# Patient Record
Sex: Male | Born: 1985 | Race: White | Hispanic: No | Marital: Married
Health system: Southern US, Community
[De-identification: ages and names within clinical notes are randomized; demographics above are authoritative.]

## PROBLEM LIST (undated history)

## (undated) DIAGNOSIS — Z789 Other specified health status: Secondary | ICD-10-CM

## (undated) DIAGNOSIS — K529 Noninfective gastroenteritis and colitis, unspecified: Secondary | ICD-10-CM

## (undated) DIAGNOSIS — R197 Diarrhea, unspecified: Secondary | ICD-10-CM

## (undated) DIAGNOSIS — R1031 Right lower quadrant pain: Secondary | ICD-10-CM

## (undated) HISTORY — DX: Right lower quadrant pain: R10.31

## (undated) HISTORY — DX: Diarrhea, unspecified: R19.7

---

## 2015-10-24 ENCOUNTER — Encounter: Payer: Self-pay | Admitting: *Deleted

## 2015-10-24 ENCOUNTER — Emergency Department: Payer: Managed Care, Other (non HMO)

## 2015-10-24 ENCOUNTER — Inpatient Hospital Stay
Admission: EM | Admit: 2015-10-24 | Discharge: 2015-10-26 | DRG: 392 | Disposition: A | Discharge status: Home / Self Care | Payer: Managed Care, Other (non HMO) | Attending: Internal Medicine | Admitting: Internal Medicine

## 2015-10-24 DIAGNOSIS — R1031 Right lower quadrant pain: Secondary | ICD-10-CM | POA: Diagnosis not present

## 2015-10-24 DIAGNOSIS — Z8379 Family history of other diseases of the digestive system: Secondary | ICD-10-CM | POA: Diagnosis not present

## 2015-10-24 DIAGNOSIS — A09 Infectious gastroenteritis and colitis, unspecified: Principal | ICD-10-CM | POA: Diagnosis present

## 2015-10-24 DIAGNOSIS — K529 Noninfective gastroenteritis and colitis, unspecified: Secondary | ICD-10-CM | POA: Diagnosis present

## 2015-10-24 DIAGNOSIS — F17210 Nicotine dependence, cigarettes, uncomplicated: Secondary | ICD-10-CM | POA: Diagnosis present

## 2015-10-24 DIAGNOSIS — R197 Diarrhea, unspecified: Secondary | ICD-10-CM | POA: Diagnosis not present

## 2015-10-24 HISTORY — DX: Other specified health status: Z78.9

## 2015-10-24 LAB — CBC
HCT: 45.1 % (ref 40.0–52.0)
HEMOGLOBIN: 15.4 g/dL (ref 13.0–18.0)
MCH: 29.4 pg (ref 26.0–34.0)
MCHC: 34.2 g/dL (ref 32.0–36.0)
MCV: 85.8 fL (ref 80.0–100.0)
PLATELETS: 278 10*3/uL (ref 150–440)
RBC: 5.25 MIL/uL (ref 4.40–5.90)
RDW: 13 % (ref 11.5–14.5)
WBC: 13.2 10*3/uL — ABNORMAL HIGH (ref 3.8–10.6)

## 2015-10-24 LAB — URINALYSIS COMPLETE WITH MICROSCOPIC (ARMC ONLY)
Bacteria, UA: NONE SEEN
Bilirubin Urine: NEGATIVE
Glucose, UA: NEGATIVE mg/dL
Hgb urine dipstick: NEGATIVE
KETONES UR: NEGATIVE mg/dL
Leukocytes, UA: NEGATIVE
Nitrite: NEGATIVE
PH: 6 (ref 5.0–8.0)
PROTEIN: NEGATIVE mg/dL
RBC / HPF: NONE SEEN RBC/hpf (ref 0–5)
Specific Gravity, Urine: 1.01 (ref 1.005–1.030)

## 2015-10-24 LAB — COMPREHENSIVE METABOLIC PANEL
ALBUMIN: 4.5 g/dL (ref 3.5–5.0)
ALT: 72 U/L — AB (ref 17–63)
AST: 41 U/L (ref 15–41)
Alkaline Phosphatase: 74 U/L (ref 38–126)
Anion gap: 11 (ref 5–15)
BUN: 14 mg/dL (ref 6–20)
CHLORIDE: 105 mmol/L (ref 101–111)
CO2: 24 mmol/L (ref 22–32)
CREATININE: 1.06 mg/dL (ref 0.61–1.24)
Calcium: 9.2 mg/dL (ref 8.9–10.3)
GFR calc Af Amer: >60 mL/min (ref >60)
GFR calc non Af Amer: >60 mL/min (ref >60)
GLUCOSE: 117 mg/dL — AB (ref 65–99)
POTASSIUM: 4 mmol/L (ref 3.5–5.1)
Sodium: 140 mmol/L (ref 135–145)
Total Bilirubin: 0.9 mg/dL (ref 0.3–1.2)
Total Protein: 7.9 g/dL (ref 6.5–8.1)

## 2015-10-24 LAB — LIPASE, BLOOD: LIPASE: 24 U/L (ref 11–51)

## 2015-10-24 MED ORDER — CIPROFLOXACIN IN D5W 400 MG/200ML IV SOLN
400.0000 mg | Freq: Two times a day (BID) | INTRAVENOUS | Status: DC
  Administered 2015-10-25 – 2015-10-26 (×3): 400 mg via INTRAVENOUS
  Filled 2015-10-24 (×5): qty 200

## 2015-10-24 MED ORDER — HYDROMORPHONE HCL 1 MG/ML IJ SOLN
1.0000 mg | Freq: Once | INTRAMUSCULAR | Status: AC
  Administered 2015-10-24: 1 mg via INTRAVENOUS

## 2015-10-24 MED ORDER — CIPROFLOXACIN IN D5W 400 MG/200ML IV SOLN
INTRAVENOUS | Status: AC
  Administered 2015-10-24: 400 mg via INTRAVENOUS
  Filled 2015-10-24: qty 200

## 2015-10-24 MED ORDER — SODIUM CHLORIDE 0.9 % IV SOLN
INTRAVENOUS | Status: DC
  Administered 2015-10-24: 22:00:00 via INTRAVENOUS
  Administered 2015-10-25: 1000 mL via INTRAVENOUS
  Administered 2015-10-25 – 2015-10-26 (×2): via INTRAVENOUS

## 2015-10-24 MED ORDER — METRONIDAZOLE IN NACL 5-0.79 MG/ML-% IV SOLN
500.0000 mg | Freq: Three times a day (TID) | INTRAVENOUS | Status: DC
  Administered 2015-10-25 – 2015-10-26 (×4): 500 mg via INTRAVENOUS
  Filled 2015-10-24 (×6): qty 100

## 2015-10-24 MED ORDER — HYDROCODONE-ACETAMINOPHEN 5-325 MG PO TABS
1.0000 | ORAL_TABLET | ORAL | Status: DC | PRN
  Administered 2015-10-24: 1 via ORAL
  Administered 2015-10-25: 2 via ORAL
  Administered 2015-10-25: 1 via ORAL
  Administered 2015-10-25 – 2015-10-26 (×3): 2 via ORAL
  Filled 2015-10-24 (×2): qty 2
  Filled 2015-10-24: qty 1
  Filled 2015-10-24: qty 2
  Filled 2015-10-24: qty 1
  Filled 2015-10-24: qty 2

## 2015-10-24 MED ORDER — HYDROMORPHONE HCL 1 MG/ML IJ SOLN
INTRAMUSCULAR | Status: AC
  Administered 2015-10-24: 1 mg via INTRAVENOUS
  Filled 2015-10-24: qty 1

## 2015-10-24 MED ORDER — DIATRIZOATE MEGLUMINE & SODIUM 66-10 % PO SOLN
15.0000 mL | Freq: Once | ORAL | Status: AC
  Administered 2015-10-24: 15 mL via ORAL

## 2015-10-24 MED ORDER — IOPAMIDOL (ISOVUE-300) INJECTION 61%
75.0000 mL | Freq: Once | INTRAVENOUS | Status: AC | PRN
  Administered 2015-10-24: 75 mL via INTRAVENOUS
  Filled 2015-10-24: qty 75

## 2015-10-24 MED ORDER — METRONIDAZOLE IN NACL 5-0.79 MG/ML-% IV SOLN
500.0000 mg | Freq: Once | INTRAVENOUS | Status: AC
  Administered 2015-10-24: 500 mg via INTRAVENOUS
  Filled 2015-10-24 (×2): qty 100

## 2015-10-24 MED ORDER — ONDANSETRON HCL 4 MG/2ML IJ SOLN
4.0000 mg | Freq: Four times a day (QID) | INTRAMUSCULAR | Status: DC | PRN
  Administered 2015-10-25 (×2): 4 mg via INTRAVENOUS
  Filled 2015-10-24 (×2): qty 2

## 2015-10-24 MED ORDER — ONDANSETRON HCL 4 MG PO TABS: 4.0000 mg | ORAL_TABLET | Freq: Four times a day (QID) | ORAL | Status: DC | PRN

## 2015-10-24 MED ORDER — SODIUM CHLORIDE 0.9 % IV BOLUS (SEPSIS)
1000.0000 mL | Freq: Once | INTRAVENOUS | Status: AC
  Administered 2015-10-24: 1000 mL via INTRAVENOUS

## 2015-10-24 MED ORDER — ENOXAPARIN SODIUM 40 MG/0.4ML ~~LOC~~ SOLN
40.0000 mg | Freq: Every day | SUBCUTANEOUS | Status: DC
  Administered 2015-10-24: 40 mg via SUBCUTANEOUS
  Filled 2015-10-24: qty 0.4

## 2015-10-24 MED ORDER — ONDANSETRON HCL 4 MG/2ML IJ SOLN
4.0000 mg | Freq: Once | INTRAMUSCULAR | Status: AC | PRN
  Administered 2015-10-24: 4 mg via INTRAVENOUS
  Filled 2015-10-24: qty 2

## 2015-10-24 MED ORDER — MORPHINE SULFATE (PF) 4 MG/ML IV SOLN
INTRAVENOUS | Status: AC
  Administered 2015-10-24: 4 mg via INTRAVENOUS
  Filled 2015-10-24: qty 1

## 2015-10-24 MED ORDER — CIPROFLOXACIN IN D5W 400 MG/200ML IV SOLN
400.0000 mg | Freq: Once | INTRAVENOUS | Status: AC
  Administered 2015-10-24: 400 mg via INTRAVENOUS
  Filled 2015-10-24: qty 200

## 2015-10-24 MED ORDER — MORPHINE SULFATE (PF) 4 MG/ML IV SOLN
4.0000 mg | Freq: Once | INTRAVENOUS | Status: AC
  Administered 2015-10-24: 4 mg via INTRAVENOUS

## 2015-10-24 MED ORDER — HYDROMORPHONE HCL 1 MG/ML IJ SOLN
1.0000 mg | INTRAMUSCULAR | Status: DC | PRN
  Administered 2015-10-25 (×2): 1 mg via INTRAVENOUS
  Filled 2015-10-24 (×2): qty 1

## 2015-10-24 NOTE — ED Notes (Signed)
Pt states abd pain and nausea that began this AM, states several episodes of vomiting, abd pain RLQ

## 2015-10-24 NOTE — ED Notes (Signed)
CT tech notified that pt finished with contrast.

## 2015-10-24 NOTE — H&P (Signed)
[facility] [place] [facility]   PATIENT NAME: Christopher Mcdowell    MR#:  [id number]  DATE OF BIRTH:  07/11/1985  DATE OF ADMISSION:  10/24/2015  PRIMARY CARE PHYSICIAN: No primary care provider on file.   REQUESTING/REFERRING PHYSICIAN: dr Thomasene MohairPaduhowski  CHIEF COMPLAINT:  Abdominal pain nausea and vomiting today  HISTORY OF PRESENT ILLNESS:  Christopher Mcdowell Christopher Mcdowell  is a 30 y.o. male with no past medical history comes to the emergency room after he started having intractable nausea vomiting and diarrhea starting today. Patient reports say contact with his daughter and wife was recovering from gastroenteritis. His pain has not been able to control with I oral pain meds. He has required several doses of IV pain medication in the emergency room. CT of the abdomen shows ileitis. He he received Cipro and Flagyl.  Patient is being admitted for further management  PAST MEDICAL HISTORY:  History reviewed. No pertinent past medical history.  PAST SURGICAL HISTOIRY:  No past surgical history on file.  SOCIAL HISTORY:   Social History  Substance Use Topics  . Smoking status: Not on file  . Smokeless tobacco: Not on file  . Alcohol Use: Not on file    FAMILY HISTORY:  No past medical history of CAD, hypertension  DRUG ALLERGIES:  No Known Allergies  REVIEW OF SYSTEMS:  Review of Systems  Constitutional: Negative for fever, chills and weight loss.  HENT: Negative for ear discharge, ear pain and nosebleeds.   Eyes: Negative for blurred vision, pain and discharge.  Respiratory: Negative for sputum production, shortness of breath, wheezing and stridor.   Cardiovascular: Negative for chest pain, palpitations, orthopnea and PND.  Gastrointestinal: Positive for vomiting, abdominal pain and diarrhea. Negative for nausea.  Genitourinary: Negative for urgency and frequency.  Musculoskeletal: Negative for back pain and joint pain.  Neurological: Positive for weakness.  Negative for sensory change, speech change and focal weakness.  Psychiatric/Behavioral: Negative for depression and hallucinations. The patient is not nervous/anxious.   All other systems reviewed and are negative.    MEDICATIONS AT HOME:   Prior to Admission medications   Not on File      VITAL SIGNS:  Blood pressure 131/75, pulse 94, temperature 98.3 F (36.8 C), temperature source Oral, resp. rate 22, height 6' (1.829 m), weight 133.811 kg (295 lb), SpO2 98 %.  PHYSICAL EXAMINATION:  GENERAL:  30 y.o.-year-old patient lying in the bed with no acute distress.  EYES: Pupils equal, round, reactive to light and accommodation. No scleral icterus. Extraocular muscles intact.  HEENT: Head atraumatic, normocephalic. Oropharynx and nasopharynx clear.  NECK:  Supple, no jugular venous distention. No thyroid enlargement, no tenderness.  LUNGS: Normal breath sounds bilaterally, no wheezing, rales,rhonchi or crepitation. No use of accessory muscles of respiration.  CARDIOVASCULAR: S1, S2 normal. No murmurs, rubs, or gallops.  ABDOMEN: Soft, nontender, nondistended. Bowel sounds present. No organomegaly or mass.  EXTREMITIES: No pedal edema, cyanosis, or clubbing.  NEUROLOGIC: Cranial nerves II through XII are intact. Muscle strength 5/5 in all extremities. Sensation intact. Gait not checked.  PSYCHIATRIC: The patient is alert and oriented x 3.  SKIN: No obvious rash, lesion, or ulcer.   LABORATORY PANEL:   CBC  Recent Labs Lab 10/24/15 1706  WBC 13.2*  HGB 15.4  HCT 45.1  PLT 278   ------------------------------------------------------------------------------------------------------------------  Chemistries   Recent Labs Lab 10/24/15 1706  NA 140  K 4.0  CL 105  CO2 24  GLUCOSE 117*  BUN  14  CREATININE 1.06  CALCIUM 9.2  AST 41  ALT 72*  ALKPHOS 74  BILITOT 0.9    ------------------------------------------------------------------------------------------------------------------  Cardiac Enzymes No results for input(s): TROPONINI in the last 168 hours. ------------------------------------------------------------------------------------------------------------------  RADIOLOGY:  Ct Abdomen Pelvis W Contrast  10/24/2015  CLINICAL DATA:  Right lower quadrant abdomen pain and nausea starting this morning. EXAM: CT ABDOMEN AND PELVIS WITH CONTRAST TECHNIQUE: Multidetector CT imaging of the abdomen and pelvis was performed using the standard protocol following bolus administration of intravenous contrast. CONTRAST:  75mL ISOVUE-300 IOPAMIDOL (ISOVUE-300) INJECTION 61% COMPARISON:  None. FINDINGS: Lower chest: No acute findings. There is no focal pneumonia or pleural effusion. The heart size is normal. Hepatobiliary: There is mild diffuse low density of the liver. The gallbladder is normal. No masses or other significant abnormality. Pancreas: No mass, inflammatory changes, or other significant abnormality. Spleen: Within normal limits in size and appearance. Adrenals/Urinary Tract: The adrenal glands and kidneys are normal. The bladder is partially decompressed without gross abnormality. No masses identified. No evidence of hydronephrosis. Stomach/Bowel: Mild thick walled distal ileal small bowel loops with surrounding mesenteric stranding are identified. The appendix is normal. There is no small bowel obstruction or diverticulitis. There is a small hiatal hernia. Vascular/Lymphatic: No pathologically enlarged lymph nodes. No evidence of abdominal aortic aneurysm. Reproductive: No mass or other significant abnormality. Other: Small amount of free fluid is identified in the right pelvis. Musculoskeletal:  No suspicious bone lesions identified. IMPRESSION: Mild abnormal thick walled distal ileal small bowel loops with surrounding mesenteric stranding consistent with  ileitis. Normal appendix. Mild fatty infiltration of liver. Electronically Signed   By: Sherian Rein M.D.   On: 10/24/2015 19:38    EKG:    IMPRESSION AND PLAN:   [name]  is a 30 y.o. male with no past medical history comes to the emergency room after he started having intractable nausea vomiting and diarrhea starting today. Patient reports say contact with his daughter and wife was recovering from gastroenteritis. His pain has not been able to control with I oral pain meds.  1. Acute gastroenteritis Patient presented with intractable nausea vomiting and abdominal pain Treat symptomatically IV fluids, pain meds, anti-emetics.  2. Acute ileitis as noted on CT scan of the abdomen. Appears more infectious etiology. We'll give patient a course of Cipro and Flagyl. If symptoms recur patient advised to see GI as an outpatient. If symptoms do not improve consider inpatient GI consultation  3. DVT prophylaxis subcutaneous Lovenox  All the records are reviewed and case discussed with ED provider. Management plans discussed with the patient, family and they are in agreement.  CODE STATUS: Full  TOTAL TIME TAKING CARE OF THIS PATIENT: 50 minutes.    Willy Pinkerton M.D on 10/24/2015 at 8:46 PM  Between 7am to 6pm - Pager - [phone]  After 6pm go to [email or web address] - password [email or web address] [facility]  [facility]  Office  [phone]  CC: Primary care physician; No primary care provider on file.

## 2015-10-24 NOTE — ED Notes (Signed)
Pt informed that urine needed for test, states will try now.  

## 2015-10-24 NOTE — ED Provider Notes (Signed)
[facility] Emergency Department Provider Note  Time seen: 5:47 PM  I have reviewed the triage vital signs and the nursing notes.   HISTORY  Chief Complaint Abdominal Pain; Nausea; and Emesis    HPI Christopher Mcdowell is a 30 y.o. male with no past medical history who presents to the emergency department with lower abdominal pain nausea and vomiting. According to the patient he awoke this morning with nausea, has had several episodes of vomiting and several episodes of diarrhea today. Around 3 PM the patient developed significant abdominal pain around his belly button which is now progressed to be completely in the right lower quadrant. Sates the pain is a 7/10 status post morphine. States the nausea has largely resolved. Describes the pain as dull aching with occasional sharp pains. Denies dysuria or hematuria. Denies history of kidney stones. Denies fever.     History reviewed. No pertinent past medical history.  There are no active problems to display for this patient.   No past surgical history on file.  No current outpatient prescriptions on file.  Allergies Review of patient's allergies indicates no known allergies.  History reviewed. No pertinent family history.  Social History Social History  Substance Use Topics  . Smoking status: None  . Smokeless tobacco: None  . Alcohol Use: None    Review of Systems Constitutional: Negative for fever. Cardiovascular: Negative for chest pain. Respiratory: Negative for shortness of breath. Gastrointestinal: Right lower quadrant abdominal pain. Positive for nausea, vomiting, diarrhea. Genitourinary: Negative for dysuria. If her hematuria Musculoskeletal: Negative for back pain. Neurological: Negative for headache 10-point ROS otherwise negative.  ____________________________________________   PHYSICAL EXAM:  VITAL SIGNS: ED Triage Vitals  Enc Vitals Group     BP 10/24/15 1705 148/88 mmHg   Pulse Rate 10/24/15 1705 111     Resp 10/24/15 1705 18     Temp 10/24/15 1705 98.3 F (36.8 C)     Temp Source 10/24/15 1705 Oral     SpO2 10/24/15 1705 98 %     Weight 10/24/15 1705 295 lb (133.811 kg)     Height 10/24/15 1705 6' (1.829 m)     Head Cir --      Peak Flow --      Pain Score 10/24/15 1705 6     Pain Loc --      Pain Edu? --      Excl. in GC? --     Constitutional: Alert and oriented. Mild distress due to pain. Eyes: Normal exam ENT   Head: Normocephalic and atraumatic.   Mouth/Throat: Mucous membranes are moist. Cardiovascular: Normal rate, regular rhythm. No murmur Respiratory: Normal respiratory effort without tachypnea nor retractions. Breath sounds are clear  Gastrointestinal: Soft, moderate right lower quadrant tenderness to palpation. Positive for mild voluntary guarding to the right lower quadrant. Positive for rebound tenderness when pressing on the left lower quadrant. Musculoskeletal: Nontender with normal range of motion in all extremities.  Neurologic:  Normal speech and language. No gross focal neurologic deficits  Skin:  Skin is warm, dry and intact.  Psychiatric: Mood and affect are normal.   ____________________________________________    RADIOLOGY  CT consistent with ileitis  ____________________________________________   INITIAL IMPRESSION / ASSESSMENT AND PLAN / ED COURSE  Pertinent labs & imaging results that were available during my care of the patient were reviewed by me and considered in my medical decision making (see chart for details).  Patient presents the emergency department with a good  story for appendicitis. We'll control the patient's pain, check labs, proceed with CT abdomen/pelvis to further evaluate. We will IV hydrate. Patient has no medical problems, takes no medications, has no allergies.  CT consistent with ileitis. No family history of IBD. Given the patient's significant discomfort, status post 3 rounds of IV  pain medication we will admit to the hospital for pain control and further treatment. We will dose the patient's antibiotics in the emergency department and admitted to the hospitalist.  ____________________________________________   FINAL CLINICAL IMPRESSION(S) / ED DIAGNOSES  Right lower quadrant abdominal pain Ileitis  Minna Antis, MD 10/24/15 [phone]

## 2015-10-25 DIAGNOSIS — R197 Diarrhea, unspecified: Secondary | ICD-10-CM

## 2015-10-25 DIAGNOSIS — K529 Noninfective gastroenteritis and colitis, unspecified: Secondary | ICD-10-CM

## 2015-10-25 DIAGNOSIS — R1031 Right lower quadrant pain: Secondary | ICD-10-CM | POA: Insufficient documentation

## 2015-10-25 LAB — CBC WITH DIFFERENTIAL/PLATELET
BASOS ABS: 0 10*3/uL (ref 0–0.1)
Basophils Relative: 0 %
EOS ABS: 0 10*3/uL (ref 0–0.7)
Eosinophils Relative: 0 %
HCT: 44.7 % (ref 40.0–52.0)
Hemoglobin: 15.3 g/dL (ref 13.0–18.0)
LYMPHS ABS: 1.1 10*3/uL (ref 1.0–3.6)
Lymphocytes Relative: 12 %
MCH: 29.5 pg (ref 26.0–34.0)
MCHC: 34.3 g/dL (ref 32.0–36.0)
MCV: 86.1 fL (ref 80.0–100.0)
MONO ABS: 0.8 10*3/uL (ref 0.2–1.0)
MONOS PCT: 8 %
Neutro Abs: 7.6 10*3/uL — ABNORMAL HIGH (ref 1.4–6.5)
Neutrophils Relative %: 80 %
PLATELETS: 251 10*3/uL (ref 150–440)
RBC: 5.2 MIL/uL (ref 4.40–5.90)
RDW: 12.9 % (ref 11.5–14.5)
WBC: 9.5 10*3/uL (ref 3.8–10.6)

## 2015-10-25 MED ORDER — HYDROMORPHONE HCL 1 MG/ML IJ SOLN
1.0000 mg | Freq: Once | INTRAMUSCULAR | Status: AC
Start: 2015-10-25 — End: 2015-10-25
  Administered 2015-10-25: 1 mg via INTRAVENOUS
  Filled 2015-10-25: qty 1

## 2015-10-25 MED ORDER — PANTOPRAZOLE SODIUM 40 MG IV SOLR
40.0000 mg | Freq: Two times a day (BID) | INTRAVENOUS | Status: DC
  Administered 2015-10-25 (×2): 40 mg via INTRAVENOUS
  Filled 2015-10-25 (×3): qty 40

## 2015-10-25 MED ORDER — HYDROMORPHONE HCL 1 MG/ML IJ SOLN
2.0000 mg | INTRAMUSCULAR | Status: DC | PRN
  Administered 2015-10-25 – 2015-10-26 (×4): 2 mg via INTRAVENOUS
  Filled 2015-10-25 (×4): qty 2

## 2015-10-25 MED ORDER — ENOXAPARIN SODIUM 40 MG/0.4ML ~~LOC~~ SOLN
40.0000 mg | Freq: Two times a day (BID) | SUBCUTANEOUS | Status: DC
  Administered 2015-10-25 – 2015-10-26 (×3): 40 mg via SUBCUTANEOUS
  Filled 2015-10-25 (×3): qty 0.4

## 2015-10-25 NOTE — Progress Notes (Signed)
Pharmacy Note - Anticoagulation  Patient with orders for enoxaparin 40mg  SQ QHS for VTE prophylaxis  Estimated Creatinine Clearance: 146.9 mL/min (by C-G formula based on Cr of 1.06). Body mass index is 40 kg/(m^2).  Will adjust to enoxaparin 40mg  SQ Q12H for BMI > 40 and CrCl > 230ml/min  Garlon HatchetJody Michaelina Blandino, PharmD Clinical Pharmacist  10/25/2015 10:14 AM

## 2015-10-25 NOTE — Consult Note (Signed)
Surgical Consultation  10/25/2015  Christopher Mcdowell is an 30 y.o. male.   CC right lower quadrant pain  HPI: This a patient with diarrhea and nausea and vomiting and right lower quadrant abdominal pain is been going on for a few days. 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 He is a definite Tour manager. He's had what he thought were fevers and chills but has not actually been febrile but has had some shaking chills. His wife may have had a gastroenteritis recently with diarrhea and a single emesis and was told that she may have noro virus. Patient is ever had an episode like this before has no melena or hematochezia. Past Medical History  Diagnosis Date  . Medical history non-contributory     History reviewed. No pertinent past surgical history.  History reviewed. No pertinent family history.  Social History:  reports that he has been passively smoking.  He uses smokeless tobacco. He reports that he does not drink alcohol or use illicit drugs.  Allergies: No Known Allergies  Medications reviewed.   Review of Systems:   Review of Systems  Constitutional: Positive for fever, chills and malaise/fatigue.  HENT: Negative.   Eyes: Negative.   Respiratory: Negative.   Cardiovascular: Negative.   Gastrointestinal: Positive for nausea, vomiting, abdominal pain and diarrhea. Negative for heartburn, constipation, blood in stool and melena.  Genitourinary: Negative.   Musculoskeletal: Negative.   Skin: Negative.   Neurological: Negative.   Endo/Heme/Allergies: Negative.   Psychiatric/Behavioral: Negative.      Physical Exam:  BP 126/67 mmHg  Pulse 88  Temp(Src) 98.1 F (36.7 C) (Oral)  Resp 19  Ht 6' (1.829 m)  Wt 295 lb (133.811 kg)  BMI 40.00 kg/m2  SpO2 98%  Physical Exam  Constitutional: He is oriented to person, place, and time and well-developed, well-nourished, and in no distress.  Visible shaking chills during the interview  HENT:  Head:  Normocephalic and atraumatic.  Eyes: Pupils are equal, round, and reactive to light. Right eye exhibits no discharge. Left eye exhibits no discharge. No scleral icterus.  Neck: Normal range of motion.  Cardiovascular: Normal rate, regular rhythm and normal heart sounds.   Pulmonary/Chest: Effort normal and breath sounds normal. No respiratory distress. He has no wheezes.  Abdominal: Soft. He exhibits no distension. There is tenderness. There is no rebound and no guarding.  Very minimal tenderness no peritoneal signs maximal in the suprapubic right lower quadrant area but no signs of peritoneal irritation  Musculoskeletal: Normal range of motion. He exhibits no edema.  Lymphadenopathy:    He has no cervical adenopathy.  Neurological: He is alert and oriented to person, place, and time.  Skin: Skin is warm and dry. No erythema.  Psychiatric: Mood and affect normal.  Vitals reviewed.     Results for orders placed or performed during the hospital encounter of 10/24/15 (from the past 48 hour(s))  Lipase, blood     Status: None   Collection Time: 10/24/15  5:06 PM  Result Value Ref Range   Lipase 24 11 - 51 U/L  Comprehensive metabolic panel     Status: Abnormal   Collection Time: 10/24/15  5:06 PM  Result Value Ref Range   Sodium 140 135 - 145 mmol/L   Potassium 4.0 3.5 - 5.1 mmol/L   Chloride 105 101 - 111 mmol/L   CO2 24 22 - 32 mmol/L   Glucose, Bld 117 (H) 65 - 99 mg/dL   BUN 14  6 - 20 mg/dL   Creatinine, Ser 1.06 0.61 - 1.24 mg/dL   Calcium 9.2 8.9 - 10.3 mg/dL   Total Protein 7.9 6.5 - 8.1 g/dL   Albumin 4.5 3.5 - 5.0 g/dL   AST 41 15 - 41 U/L   ALT 72 (H) 17 - 63 U/L   Alkaline Phosphatase 74 38 - 126 U/L   Total Bilirubin 0.9 0.3 - 1.2 mg/dL   GFR calc non Af Amer >60 >60 mL/min   GFR calc Af Amer >60 >60 mL/min    Comment: (NOTE) The eGFR has been calculated using the CKD EPI equation. This calculation has not been validated in all clinical situations. eGFR's  persistently <60 mL/min signify possible Chronic Kidney Disease.    Anion gap 11 5 - 15  CBC     Status: Abnormal   Collection Time: 10/24/15  5:06 PM  Result Value Ref Range   WBC 13.2 (H) 3.8 - 10.6 K/uL   RBC 5.25 4.40 - 5.90 MIL/uL   Hemoglobin 15.4 13.0 - 18.0 g/dL   HCT 45.1 40.0 - 52.0 %   MCV 85.8 80.0 - 100.0 fL   MCH 29.4 26.0 - 34.0 pg   MCHC 34.2 32.0 - 36.0 g/dL   RDW 13.0 11.5 - 14.5 %   Platelets 278 150 - 440 K/uL  Urinalysis complete, with microscopic ([facility] only)     Status: Abnormal   Collection Time: 10/24/15  7:16 PM  Result Value Ref Range   Color, Urine YELLOW (A) YELLOW   APPearance CLEAR (A) CLEAR   Glucose, UA NEGATIVE NEGATIVE mg/dL   Bilirubin Urine NEGATIVE NEGATIVE   Ketones, ur NEGATIVE NEGATIVE mg/dL   Specific Gravity, Urine 1.010 1.005 - 1.030   Hgb urine dipstick NEGATIVE NEGATIVE   pH 6.0 5.0 - 8.0   Protein, ur NEGATIVE NEGATIVE mg/dL   Nitrite NEGATIVE NEGATIVE   Leukocytes, UA NEGATIVE NEGATIVE   RBC / HPF NONE SEEN 0 - 5 RBC/hpf   WBC, UA 0-5 0 - 5 WBC/hpf   Bacteria, UA NONE SEEN NONE SEEN   Squamous Epithelial / LPF 0-5 (A) NONE SEEN   Mucous PRESENT   CBC with Differential/Platelet     Status: Abnormal   Collection Time: 10/25/15 11:55 AM  Result Value Ref Range   WBC 9.5 3.8 - 10.6 K/uL   RBC 5.20 4.40 - 5.90 MIL/uL   Hemoglobin 15.3 13.0 - 18.0 g/dL   HCT 44.7 40.0 - 52.0 %   MCV 86.1 80.0 - 100.0 fL   MCH 29.5 26.0 - 34.0 pg   MCHC 34.3 32.0 - 36.0 g/dL   RDW 12.9 11.5 - 14.5 %   Platelets 251 150 - 440 K/uL   Neutrophils Relative % 80 %   Lymphocytes Relative 12 %   Monocytes Relative 8 %   Eosinophils Relative 0 %   Basophils Relative 0 %   Neutro Abs 7.6 (H) 1.4 - 6.5 K/uL   Lymphs Abs 1.1 1.0 - 3.6 K/uL   Monocytes Absolute 0.8 0.2 - 1.0 K/uL   Eosinophils Absolute 0.0 0 - 0.7 K/uL   Basophils Absolute 0.0 0 - 0.1 K/uL   Ct Abdomen Pelvis W Contrast  10/24/2015  CLINICAL DATA:  Right lower quadrant abdomen  pain and nausea starting this morning. EXAM: CT ABDOMEN AND PELVIS WITH CONTRAST TECHNIQUE: Multidetector CT imaging of the abdomen and pelvis was performed using the standard protocol following bolus administration of intravenous contrast. CONTRAST:  41m ISOVUE-300  IOPAMIDOL (ISOVUE-300) INJECTION 61% COMPARISON:  None. FINDINGS: Lower chest: No acute findings. There is no focal pneumonia or pleural effusion. The heart size is normal. Hepatobiliary: There is mild diffuse low density of the liver. The gallbladder is normal. No masses or other significant abnormality. Pancreas: No mass, inflammatory changes, or other significant abnormality. Spleen: Within normal limits in size and appearance. Adrenals/Urinary Tract: The adrenal glands and kidneys are normal. The bladder is partially decompressed without gross abnormality. No masses identified. No evidence of hydronephrosis. Stomach/Bowel: Mild thick walled distal ileal small bowel loops with surrounding mesenteric stranding are identified. The appendix is normal. There is no small bowel obstruction or diverticulitis. There is a small hiatal hernia. Vascular/Lymphatic: No pathologically enlarged lymph nodes. No evidence of abdominal aortic aneurysm. Reproductive: No mass or other significant abnormality. Other: Small amount of free fluid is identified in the right pelvis. Musculoskeletal:  No suspicious bone lesions identified. IMPRESSION: Mild abnormal thick walled distal ileal small bowel loops with surrounding mesenteric stranding consistent with ileitis. Normal appendix. Mild fatty infiltration of liver. Electronically Signed   By: Abelardo Diesel M.D.   On: 10/24/2015 19:38    Assessment/Plan:  This patient with nausea vomiting diarrhea and right lower quadrant pain without signs of peritoneal irritation or other signs to suggest appendicitis his wife had been ill earlier in the week with a similar but less severe syndrome. There was a question of normal  virus. Currently I see no evidence of this could be a surgical condition and the patient's family has a remote history of Crohn's disease. At that in mind I would have no surgical plans for this patient but will continue to observe.  Florene Glen, MD, FACS

## 2015-10-25 NOTE — Progress Notes (Signed)
[facility] - [place] at [facility]                                                                                                                                                                                            Patient Demographics   Christopher Mcdowell, is a 30 y.o. male, DOB - [birth year], AVW:[id number]  Admit date - 10/24/2015   Admitting Physician Enedina Finner, MD  Outpatient Primary MD for the patient is No primary care provider on file.   LOS - 1  Subjective:pt c/o severe abdominal pain right lower abdomen     Review of Systems:   CONSTITUTIONAL: No documented fever. No fatigue, weakness. No weight gain, no weight loss.  EYES: No blurry or double vision.  ENT: No tinnitus. No postnasal drip. No redness of the oropharynx.  RESPIRATORY: No cough, no wheeze, no hemoptysis. No dyspnea.  CARDIOVASCULAR: No chest pain. No orthopnea. No palpitations. No syncope.  GASTROINTESTINAL: No nausea, no vomiting or diarrhea. Right lower abdominal pain. No melena or hematochezia.  GENITOURINARY: No dysuria or hematuria.  ENDOCRINE: No polyuria or nocturia. No heat or cold intolerance.  HEMATOLOGY: No anemia. No bruising. No bleeding.  INTEGUMENTARY: No rashes. No lesions.  MUSCULOSKELETAL: No arthritis. No swelling. No gout.  NEUROLOGIC: No numbness, tingling, or ataxia. No seizure-type activity.  PSYCHIATRIC: No anxiety. No insomnia. No ADD.    Vitals:   Filed Vitals:   10/24/15 2134 10/25/15 0529 10/25/15 1028 10/25/15 1200  BP: 130/68 123/63  126/67  Pulse: 114 89  88  Temp: 98.3 F (36.8 C) 98 F (36.7 C) 98.5 F (36.9 C) 98.1 F (36.7 C)  TempSrc: Oral Oral Oral Oral  Resp: Height:      Weight:      SpO2: 98% 97%  98%    Wt Readings from Last 3 Encounters:  10/24/15 133.811 kg (295 lb)     Intake/Output Summary (Last 24 hours) at 10/25/15 1434 Last data filed at 10/25/15 1300  Gross per 24 hour  Intake 1002.5 ml  Output     800 ml  Net  202.5 ml    Physical Exam:   GENERAL: Pleasant-appearing in no apparent distress.  HEAD, EYES, EARS, NOSE AND THROAT: Atraumatic, normocephalic. Extraocular muscles are intact. Pupils equal and reactive to light. Sclerae anicteric. No conjunctival injection. No oro-pharyngeal erythema.  NECK: Supple. There is no jugular venous distention. No bruits, no lymphadenopathy, no thyromegaly.  HEART: Regular rate and rhythm,. No murmurs, no rubs, no clicks.  LUNGS: Clear to auscultation bilaterally. No rales or rhonchi. No wheezes.  ABDOMEN:  Soft, flat, right lower abdomen tenderness, nondistended. Has good bowel sounds. No hepatosplenomegaly appreciated.  EXTREMITIES: No evidence of any cyanosis, clubbing, or peripheral edema.  +2 pedal and radial pulses bilaterally.  NEUROLOGIC: The patient is alert, awake, and oriented x3 with no focal motor or sensory deficits appreciated bilaterally.  SKIN: Moist and warm with no rashes appreciated.  Psych: Not anxious, depressed LN: No inguinal LN enlargement    Antibiotics   Anti-infectives    Start     Dose/Rate Route Frequency Ordered Stop   10/25/15 0800  ciprofloxacin (CIPRO) IVPB 400 mg     400 mg 200 mL/hr over 60 Minutes Intravenous Every 12 hours 10/24/15 2045     10/24/15 2100  metroNIDAZOLE (FLAGYL) IVPB 500 mg     500 mg 100 mL/hr over 60 Minutes Intravenous Every 8 hours 10/24/15 2045     10/24/15 2000  metroNIDAZOLE (FLAGYL) IVPB 500 mg     500 mg 100 mL/hr over 60 Minutes Intravenous  Once 10/24/15 1951 10/24/15 2101   10/24/15 2000  ciprofloxacin (CIPRO) IVPB 400 mg     400 mg 200 mL/hr over 60 Minutes Intravenous  Once 10/24/15 1951 10/24/15 2101      Medications   Scheduled Meds: . ciprofloxacin  400 mg Intravenous Q12H  . enoxaparin (LOVENOX) injection  40 mg Subcutaneous Q12H  . metronidazole  500 mg Intravenous Q8H  . pantoprazole (PROTONIX) IV  40 mg Intravenous Q12H   Continuous Infusions: . sodium  chloride 1,000 mL (10/25/15 1349)   PRN Meds:.HYDROcodone-acetaminophen, HYDROmorphone (DILAUDID) injection, ondansetron **OR** ondansetron (ZOFRAN) IV   Data Review:   Micro Results No results found for this or any previous visit (from the past 240 hour(s)).  Radiology Reports Ct Abdomen Pelvis W Contrast  10/24/2015  CLINICAL DATA:  Right lower quadrant abdomen pain and nausea starting this morning. EXAM: CT ABDOMEN AND PELVIS WITH CONTRAST TECHNIQUE: Multidetector CT imaging of the abdomen and pelvis was performed using the standard protocol following bolus administration of intravenous contrast. CONTRAST:  75mL ISOVUE-300 IOPAMIDOL (ISOVUE-300) INJECTION 61% COMPARISON:  None. FINDINGS: Lower chest: No acute findings. There is no focal pneumonia or pleural effusion. The heart size is normal. Hepatobiliary: There is mild diffuse low density of the liver. The gallbladder is normal. No masses or other significant abnormality. Pancreas: No mass, inflammatory changes, or other significant abnormality. Spleen: Within normal limits in size and appearance. Adrenals/Urinary Tract: The adrenal glands and kidneys are normal. The bladder is partially decompressed without gross abnormality. No masses identified. No evidence of hydronephrosis. Stomach/Bowel: Mild thick walled distal ileal small bowel loops with surrounding mesenteric stranding are identified. The appendix is normal. There is no small bowel obstruction or diverticulitis. There is a small hiatal hernia. Vascular/Lymphatic: No pathologically enlarged lymph nodes. No evidence of abdominal aortic aneurysm. Reproductive: No mass or other significant abnormality. Other: Small amount of free fluid is identified in the right pelvis. Musculoskeletal:  No suspicious bone lesions identified. IMPRESSION: Mild abnormal thick walled distal ileal small bowel loops with surrounding mesenteric stranding consistent with ileitis. Normal appendix. Mild fatty  infiltration of liver. Electronically Signed   By: Sherian Rein M.D.   On: 10/24/2015 19:38     CBC  Recent Labs Lab 10/24/15 1706 10/25/15 1155  WBC 13.2* 9.5  HGB 15.4 15.3  HCT 45.1 44.7  PLT 278 251  MCV 85.8 86.1  MCH 29.4 29.5  MCHC 34.2 34.3  RDW 13.0 12.9  LYMPHSABS  --  1.1  MONOABS  --  0.8  EOSABS  --  0.0  BASOSABS  --  0.0    Chemistries   Recent Labs Lab 10/24/15 1706  NA 140  K 4.0  CL 105  CO2 24  GLUCOSE 117*  BUN 14  CREATININE 1.06  CALCIUM 9.2  AST 41  ALT 72*  ALKPHOS 74  BILITOT 0.9   ------------------------------------------------------------------------------------------------------------------ estimated creatinine clearance is 146.9 mL/min (by C-G formula based on Cr of 1.06). ------------------------------------------------------------------------------------------------------------------ No results for input(s): HGBA1C in the last 72 hours. ------------------------------------------------------------------------------------------------------------------ No results for input(s): CHOL, HDL, LDLCALC, TRIG, CHOLHDL, LDLDIRECT in the last 72 hours. ------------------------------------------------------------------------------------------------------------------ No results for input(s): TSH, T4TOTAL, T3FREE, THYROIDAB in the last 72 hours.  Invalid input(s): FREET3 ------------------------------------------------------------------------------------------------------------------ No results for input(s): VITAMINB12, FOLATE, FERRITIN, TIBC, IRON, RETICCTPCT in the last 72 hours.  Coagulation profile No results for input(s): INR, PROTIME in the last 168 hours.  No results for input(s): DDIMER in the last 72 hours.  Cardiac Enzymes No results for input(s): CKMB, TROPONINI, MYOGLOBIN in the last 168 hours.  Invalid input(s):  CK ------------------------------------------------------------------------------------------------------------------ Invalid input(s): POCBNP    Assessment & Plan   Lauris ChromanDerrick Depree is a 30 y.o. male with no past medical history comes to the emergency room after he started having intractable nausea vomiting and diarrhea starting today. Patient reports say contact with his daughter and wife was recovering from gastroenteritis. His pain has not been able to control with I oral pain meds.  1. Acute gastroenteritis Supportive care with IVF  2. Acute ileitis as noted on CT scan of the abdomen. Pt with severe abdominal pain , I will ask GI to see as well as severe pain  I start patient on Protonix IV bid  3. DVT prophylaxis subcutaneous Lovenox      Code Status Orders        Start     Ordered   10/24/15 2138  Full code   Continuous     10/24/15 2137    Code Status History    Date Active Date Inactive Code Status Order ID Comments User Context   This patient has a current code status but no historical code status.    Advance Directive Documentation        Most Recent Value   Type of Advance Directive  Healthcare Power of Attorney, Living will   Pre-existing out of facility DNR order (yellow form or pink MOST form)     "MOST" Form in Place?             Consults   Gi, surgery   DVT Prophylaxis  Lovenox  Lab Results  Component Value Date   PLT 251 10/25/2015     Time Spent in minutes 35min  Greater than 50% of time spent in care coordination and counseling patient regarding the condition and plan of care.   Auburn BilberryPATEL, Ladeana Laplant M.D on 10/25/2015 at 2:34 PM  Between 7am to 6pm - Pager - [phone]  After 6pm go to [email or web address] - password [email or web address] [facility]   Office  [phone]

## 2015-10-25 NOTE — Consult Note (Signed)
GI Inpatient Consult Note  Reason for Consult: Abdominal pain   Attending Requesting Consult: Dr. Allena Mcdowell  History of Present Illness: Christopher Mcdowell is a 30 y.o. male with no significant past medical history reports that he started having nausea and diarrhea yesterday morning around 8 AM.  This continued until 3:00 yesterday afternoon and then he began to have 10 out of 10 abdominal pain that occurred during a bowel movement.  He began to vomit at that time and continued vomiting until 4:30 PM.  He presented to the emergency room around 5 PM due to the continuing abdominal pain.  He described it as right lower and mid abdominal pain radiating to his groin area.  He also reports chills, afebrile.  He denies any blood.  His wife is present and reports that she had been feeling sick on Tuesday, she was experiencing nausea and fever, went to her primary care provider told to put on a mask and at that time began to feel more nauseous and had a single episode of vomiting and diarrhea.  She was given a shot of Phenergan and reports she felt fine the next day.  Their daughter does attend daycare, reports she believes she got the illness from there.  [place] in [place], he works closer to this area. He was at work during this episode.  He has never had a colonoscopy before, no family history of colon cancer rectal cancer, no family history of autoimmune disease.   Past Medical History:  Past Medical History  Diagnosis Date  . Medical history non-contributory     Problem List: Patient Active Problem List   Diagnosis Date Noted  . Ileitis 10/24/2015    Past Surgical History: History reviewed. No pertinent past surgical history.  Allergies: No Known Allergies  Home Medications: No prescriptions prior to admission   Home medication reconciliation was completed with the patient.   Scheduled Inpatient Medications:   . ciprofloxacin  400 mg Intravenous Q12H  . enoxaparin  (LOVENOX) injection  40 mg Subcutaneous Q12H  . metronidazole  500 mg Intravenous Q8H  . pantoprazole (PROTONIX) IV  40 mg Intravenous Q12H    Continuous Inpatient Infusions:   . sodium chloride 75 mL/hr at 10/24/15 2200    PRN Inpatient Medications:  HYDROcodone-acetaminophen, HYDROmorphone (DILAUDID) injection, ondansetron **OR** ondansetron (ZOFRAN) IV  Family History: family history is not on file.   Social History:   reports that he has been passively smoking.  He uses smokeless tobacco. He reports that he does not drink alcohol or use illicit drugs. T  Review of Systems: Constitutional: Weight is stable.  Eyes: No changes in vision. ENT: No oral lesions, sore throat.  GI: see HPI.  Heme/Lymph: No easy bruising.  CV: No chest pain.  GU: No hematuria.  Integumentary: No rashes.  Neuro: No headaches.  Psych: No depression/anxiety.  Endocrine: No heat/cold intolerance.  Allergic/Immunologic: No urticaria.  Resp: No cough, SOB.  Musculoskeletal: No joint swelling.    Physical Examination: BP 126/67 mmHg  Pulse 88  Temp(Src) 98.1 F (36.7 C) (Oral)  Resp 19  Ht 6' (1.829 m)  Wt 133.811 kg (295 lb)  BMI 40.00 kg/m2  SpO2 98% Gen: NAD, alert and oriented x 4, wife is present and helps with history HEENT: PEERLA, EOMI, Neck: supple, no JVD or thyromegaly Chest: CTA bilaterally, no wheezes, crackles, or other adventitious sounds CV: RRR, no m/g/c/r Abd: generalized tenderness, ND, +BS in all four quadrants; no HSM, guarding, ridigity,  or rebound tenderness Ext: no edema, well perfused with 2+ pulses, Skin: no rash or lesions noted Lymph: no LAD  Data: Lab Results  Component Value Date   WBC 9.5 10/25/2015   HGB 15.3 10/25/2015   HCT 44.7 10/25/2015   MCV 86.1 10/25/2015   PLT 251 10/25/2015    Recent Labs Lab 10/24/15 1706 10/25/15 1155  HGB 15.4 15.3   Lab Results  Component Value Date   NA 140 10/24/2015   K 4.0 10/24/2015   CL 105 10/24/2015    CO2 24 10/24/2015   BUN 14 10/24/2015   CREATININE 1.06 10/24/2015   Lab Results  Component Value Date   ALT 72* 10/24/2015   AST 41 10/24/2015   ALKPHOS 74 10/24/2015   BILITOT 0.9 10/24/2015   No results for input(s): APTT, INR, PTT in the last 168 hours.  Imaging: CLINICAL DATA: Right lower quadrant abdomen pain and nausea starting this morning.  EXAM: CT ABDOMEN AND PELVIS WITH CONTRAST  TECHNIQUE: Multidetector CT imaging of the abdomen and pelvis was performed using the standard protocol following bolus administration of intravenous contrast.  CONTRAST: 75mL ISOVUE-300 IOPAMIDOL (ISOVUE-300) INJECTION 61%  COMPARISON: None.  FINDINGS: Lower chest: No acute findings. There is no focal pneumonia or pleural effusion. The heart size is normal.  Hepatobiliary: There is mild diffuse low density of the liver. The gallbladder is normal. No masses or other significant abnormality.  Pancreas: No mass, inflammatory changes, or other significant abnormality.  Spleen: Within normal limits in size and appearance.  Adrenals/Urinary Tract: The adrenal glands and kidneys are normal. The bladder is partially decompressed without gross abnormality. No masses identified. No evidence of hydronephrosis.  Stomach/Bowel: Mild thick walled distal ileal small bowel loops with surrounding mesenteric stranding are identified. The appendix is normal. There is no small bowel obstruction or diverticulitis. There is a small hiatal hernia.  Vascular/Lymphatic: No pathologically enlarged lymph nodes. No evidence of abdominal aortic aneurysm.  Reproductive: No mass or other significant abnormality.  Other: Small amount of free fluid is identified in the right pelvis.  Musculoskeletal: No suspicious bone lesions identified.  IMPRESSION: Mild abnormal thick walled distal ileal small bowel loops with surrounding mesenteric stranding consistent with ileitis.  Normal  appendix.  Mild fatty infiltration of liver.   Electronically Signed  By: Sherian ReinWei-Chen Lin M.D.  On: 10/24/2015 19:38  Assessment/Plan: Mr. Christopher Mcdowell is a 30 y.o. male  With abdominal pain, ileitis on CT scan, WBC on admission 13.2, currently 9.5.  Is currently being treated with Cipro and Flagyl, Protonix 40 mg twice a day IV, Zofran, tolerating clear liquids   Recommendations: We recommend continuing Cipro/Flagyl, completing GI panel when able, continuing to monitor symptoms and advance diet as tolerated.  We will continue to follow with you.  Thank you for the consult. Please call with questions or concerns.  Carney Harderari S Richards, PA-C  I personally performed these services.

## 2015-10-26 LAB — CBC
HCT: 42 % (ref 40.0–52.0)
Hemoglobin: 13.9 g/dL (ref 13.0–18.0)
MCH: 29 pg (ref 26.0–34.0)
MCHC: 33.2 g/dL (ref 32.0–36.0)
MCV: 87.3 fL (ref 80.0–100.0)
PLATELETS: 204 10*3/uL (ref 150–440)
RBC: 4.81 MIL/uL (ref 4.40–5.90)
RDW: 13 % (ref 11.5–14.5)
WBC: 12.3 10*3/uL — ABNORMAL HIGH (ref 3.8–10.6)

## 2015-10-26 LAB — GASTROINTESTINAL PANEL BY PCR, STOOL (REPLACES STOOL CULTURE)
ADENOVIRUS F40/41: NOT DETECTED
Astrovirus: NOT DETECTED
CRYPTOSPORIDIUM: NOT DETECTED
Campylobacter species: NOT DETECTED
Cyclospora cayetanensis: NOT DETECTED
E. coli O157: NOT DETECTED
ENTAMOEBA HISTOLYTICA: NOT DETECTED
ENTEROAGGREGATIVE E COLI (EAEC): NOT DETECTED
Enteropathogenic E coli (EPEC): NOT DETECTED
Enterotoxigenic E coli (ETEC): NOT DETECTED
GIARDIA LAMBLIA: NOT DETECTED
NOROVIRUS GI/GII: NOT DETECTED
Plesimonas shigelloides: NOT DETECTED
Rotavirus A: NOT DETECTED
SALMONELLA SPECIES: NOT DETECTED
SHIGELLA/ENTEROINVASIVE E COLI (EIEC): NOT DETECTED
Sapovirus (I, II, IV, and V): DETECTED — AB
Shiga like toxin producing E coli (STEC): NOT DETECTED
VIBRIO CHOLERAE: NOT DETECTED
Vibrio species: NOT DETECTED
YERSINIA ENTEROCOLITICA: NOT DETECTED

## 2015-10-26 MED ORDER — METRONIDAZOLE 500 MG PO TABS: 500.0000 mg | ORAL_TABLET | Freq: Three times a day (TID) | ORAL | Status: DC

## 2015-10-26 MED ORDER — HYDROCODONE-ACETAMINOPHEN 5-325 MG PO TABS: 1.0000 | ORAL_TABLET | ORAL | Status: DC | PRN

## 2015-10-26 MED ORDER — METRONIDAZOLE 500 MG PO TABS
500.0000 mg | ORAL_TABLET | Freq: Three times a day (TID) | ORAL | Status: DC
  Administered 2015-10-26: 500 mg via ORAL
  Filled 2015-10-26: qty 1

## 2015-10-26 MED ORDER — ONDANSETRON HCL 4 MG PO TABS: 4.0000 mg | ORAL_TABLET | Freq: Four times a day (QID) | ORAL | Status: DC | PRN

## 2015-10-26 MED ORDER — CIPROFLOXACIN HCL 500 MG PO TABS: 500.0000 mg | ORAL_TABLET | Freq: Two times a day (BID) | ORAL | Status: DC

## 2015-10-26 MED ORDER — CIPROFLOXACIN HCL 500 MG PO TABS
500.0000 mg | ORAL_TABLET | Freq: Two times a day (BID) | ORAL | Status: DC
  Administered 2015-10-26: 500 mg via ORAL
  Filled 2015-10-26: qty 1

## 2015-10-26 NOTE — Progress Notes (Signed)
Patient discharged to home as ordered. Patient tolerated po antibiotics, denies being nauseated and no vomiting noted. Patient instructed to make follow up appointment with Dr. Shelle Ironein in two weeks. No acute distress noted. Patient father at the bedside.

## 2015-10-26 NOTE — Discharge Summary (Signed)
[facility] - [place] at [facility]   PATIENT NAME: Christopher Mcdowell    MR#:  [id number]  DATE OF BIRTH:  21-Nov-1985  DATE OF ADMISSION:  10/24/2015 ADMITTING PHYSICIAN: Enedina Finner, MD  DATE OF DISCHARGE: 10/26/15  PRIMARY CARE PHYSICIAN: No primary care provider on file.    ADMISSION DIAGNOSIS:  Ileitis [K52.9]  DISCHARGE DIAGNOSIS:  Acute Ilietis  SECONDARY DIAGNOSIS:   Past Medical History  Diagnosis Date  . Medical history non-contributory     HOSPITAL COURSE:  Christopher Mcdowell is a 30 y.o. male with no past medical history comes to the emergency room after he started having intractable nausea vomiting and diarrhea starting today. Patient reports say contact with his daughter and wife was recovering from gastroenteritis. His pain has not been able to control with I oral pain meds.  1. Acute gastroenteritis Supportive care with IVF  2. Acute ileitis as noted on CT scan of the abdomen. Pt with severe abdominal pain  Gastroenteritis panel positive for Sapovirus Pt will finish course of antibiotic and f/u GI if s/s do not improve for further w/u  3. DVT prophylaxis subcutaneous Lovenox D/c home later today Able to keep FLD well CONSULTS OBTAINED:  Treatment Team:  Lattie Haw, MD Elnita Maxwell, MD  DRUG ALLERGIES:  No Known Allergies  DISCHARGE MEDICATIONS:   Current Discharge Medication List    START taking these medications   Details  ciprofloxacin (CIPRO) 500 MG tablet Take 1 tablet (500 mg total) by mouth 2 (two) times daily. Qty: 16 tablet, Refills: 0    HYDROcodone-acetaminophen (NORCO/VICODIN) 5-325 MG tablet Take 1-2 tablets by mouth every 4 (four) hours as needed for moderate pain. Qty: 30 tablet, Refills: 0    metroNIDAZOLE (FLAGYL) 500 MG tablet Take 1 tablet (500 mg total) by mouth every 8 (eight) hours. Qty: 24 tablet, Refills: 0    ondansetron (ZOFRAN) 4 MG tablet Take 1 tablet (4 mg total) by mouth every 6  (six) hours as needed for nausea. Qty: 20 tablet, Refills: 0        If you experience worsening of your admission symptoms, develop shortness of breath, life threatening emergency, suicidal or homicidal thoughts you must seek medical attention immediately by calling 911 or calling your MD immediately  if symptoms less severe.  You Must read complete instructions/literature along with all the possible adverse reactions/side effects for all the Medicines you take and that have been prescribed to you. Take any new Medicines after you have completely understood and accept all the possible adverse reactions/side effects.   Please note  You were cared for by a hospitalist during your hospital stay. If you have any questions about your discharge medications or the care you received while you were in the hospital after you are discharged, you can call the unit and asked to speak with the hospitalist on call if the hospitalist that took care of you is not available. Once you are discharged, your primary care physician will handle any further medical issues. Please note that NO REFILLS for any discharge medications will be authorized once you are discharged, as it is imperative that you return to your primary care physician (or establish a relationship with a primary care physician if you do not have one) for your aftercare needs so that they can reassess your need for medications and monitor your lab values. Today   SUBJECTIVE   Feels better today  VITAL SIGNS:  Blood pressure 132/64, pulse 109, temperature  99.3 F (37.4 C), temperature source Oral, resp. rate 18, height 6' (1.829 m), weight 133.811 kg (295 lb), SpO2 97 %.  I/O:   Intake/Output Summary (Last 24 hours) at 10/26/15 1412 Last data filed at 10/26/15 1046  Gross per 24 hour  Intake   1467 ml  Output   1500 ml  Net    -33 ml    PHYSICAL EXAMINATION:  GENERAL:  30 y.o.-year-old patient lying in the bed with no acute distress.   EYES: Pupils equal, round, reactive to light and accommodation. No scleral icterus. Extraocular muscles intact.  HEENT: Head atraumatic, normocephalic. Oropharynx and nasopharynx clear.  NECK:  Supple, no jugular venous distention. No thyroid enlargement, no tenderness.  LUNGS: Normal breath sounds bilaterally, no wheezing, rales,rhonchi or crepitation. No use of accessory muscles of respiration.  CARDIOVASCULAR: S1, S2 normal. No murmurs, rubs, or gallops.  ABDOMEN: Soft, non-tender, non-distended. Bowel sounds present. No organomegaly or mass.  EXTREMITIES: No pedal edema, cyanosis, or clubbing.  NEUROLOGIC: Cranial nerves II through XII are intact. Muscle strength 5/5 in all extremities. Sensation intact. Gait not checked.  PSYCHIATRIC: The patient is alert and oriented x 3.  SKIN: No obvious rash, lesion, or ulcer.   DATA REVIEW:   CBC   Recent Labs Lab 10/26/15 0558  WBC 12.3*  HGB 13.9  HCT 42.0  PLT 204    Chemistries   Recent Labs Lab 10/24/15 1706  NA 140  K 4.0  CL 105  CO2 24  GLUCOSE 117*  BUN 14  CREATININE 1.06  CALCIUM 9.2  AST 41  ALT 72*  ALKPHOS 74  BILITOT 0.9    Microbiology Results   Recent Results (from the past 240 hour(s))  Gastrointestinal Panel by PCR , Stool     Status: Abnormal   Collection Time: 10/25/15  9:57 AM  Result Value Ref Range Status   Campylobacter species NOT DETECTED NOT DETECTED Final   Plesimonas shigelloides NOT DETECTED NOT DETECTED Final   Salmonella species NOT DETECTED NOT DETECTED Final   Yersinia enterocolitica NOT DETECTED NOT DETECTED Final   Vibrio species NOT DETECTED NOT DETECTED Final   Vibrio cholerae NOT DETECTED NOT DETECTED Final   Enteroaggregative E coli (EAEC) NOT DETECTED NOT DETECTED Final   Enteropathogenic E coli (EPEC) NOT DETECTED NOT DETECTED Final   Enterotoxigenic E coli (ETEC) NOT DETECTED NOT DETECTED Final   Shiga like toxin producing E coli (STEC) NOT DETECTED NOT DETECTED Final    E. coli O157 NOT DETECTED NOT DETECTED Final   Shigella/Enteroinvasive E coli (EIEC) NOT DETECTED NOT DETECTED Final   Cryptosporidium NOT DETECTED NOT DETECTED Final   Cyclospora cayetanensis NOT DETECTED NOT DETECTED Final   Entamoeba histolytica NOT DETECTED NOT DETECTED Final   Giardia lamblia NOT DETECTED NOT DETECTED Final   Adenovirus F40/41 NOT DETECTED NOT DETECTED Final   Astrovirus NOT DETECTED NOT DETECTED Final   Norovirus GI/GII NOT DETECTED NOT DETECTED Final   Rotavirus A NOT DETECTED NOT DETECTED Final   Sapovirus (I, II, IV, and V) DETECTED (A) NOT DETECTED Final    RADIOLOGY:  Ct Abdomen Pelvis W Contrast  10/24/2015  CLINICAL DATA:  Right lower quadrant abdomen pain and nausea starting this morning. EXAM: CT ABDOMEN AND PELVIS WITH CONTRAST TECHNIQUE: Multidetector CT imaging of the abdomen and pelvis was performed using the standard protocol following bolus administration of intravenous contrast. CONTRAST:  75mL ISOVUE-300 IOPAMIDOL (ISOVUE-300) INJECTION 61% COMPARISON:  None. FINDINGS: Lower chest: No acute findings. There  is no focal pneumonia or pleural effusion. The heart size is normal. Hepatobiliary: There is mild diffuse low density of the liver. The gallbladder is normal. No masses or other significant abnormality. Pancreas: No mass, inflammatory changes, or other significant abnormality. Spleen: Within normal limits in size and appearance. Adrenals/Urinary Tract: The adrenal glands and kidneys are normal. The bladder is partially decompressed without gross abnormality. No masses identified. No evidence of hydronephrosis. Stomach/Bowel: Mild thick walled distal ileal small bowel loops with surrounding mesenteric stranding are identified. The appendix is normal. There is no small bowel obstruction or diverticulitis. There is a small hiatal hernia. Vascular/Lymphatic: No pathologically enlarged lymph nodes. No evidence of abdominal aortic aneurysm. Reproductive: No mass or  other significant abnormality. Other: Small amount of free fluid is identified in the right pelvis. Musculoskeletal:  No suspicious bone lesions identified. IMPRESSION: Mild abnormal thick walled distal ileal small bowel loops with surrounding mesenteric stranding consistent with ileitis. Normal appendix. Mild fatty infiltration of liver. Electronically Signed   By: Sherian ReinWei-Chen  Lin M.D.   On: 10/24/2015 19:38     Management plans discussed with the patient, family and they are in agreement.  CODE STATUS:     Code Status Orders        Start     Ordered   10/24/15 2138  Full code   Continuous     10/24/15 2137    Code Status History    Date Active Date Inactive Code Status Order ID Comments User Context   This patient has a current code status but no historical code status.    Advance Directive Documentation        Most Recent Value   Type of Advance Directive  Healthcare Power of Attorney, Living will   Pre-existing out of facility DNR order (yellow form or pink MOST form)     "MOST" Form in Place?        TOTAL TIME TAKING CARE OF THIS PATIENT: 40 minutes.    Amalee Olsen M.D on 10/26/2015 at 2:12 PM  Between 7am to 6pm - Pager - [phone] After 6pm go to [email or web address] - password [email or web address] [facility]  [facility]  Office  [phone]  CC: Primary care physician; No primary care provider on file.

## 2015-10-26 NOTE — Progress Notes (Signed)
CC: Diarrhea Subjective: This patient with enteritis of unclear etiology who has improving right lower quadrant pain he states he feels better today and actually states his pain is not necessarily just in the right lower quadrant both lower quadrants he is continuing having diarrhea but no further nausea or vomiting. No fevers or chills.  Objective: Vital signs in last 24 hours: Temp:  [98.1 F (36.7 C)-99.5 F (37.5 C)] 98.1 F (36.7 C) (04/29 0522) Pulse Rate:  [84-91] 84 (04/29 0522) Resp:  [19-24] 24 (04/29 0522) BP: (124-126)/(56-67) 124/56 mmHg (04/29 0522) SpO2:  [94 %-99 %] 94 % (04/29 0522) Last BM Date: 10/24/15  Intake/Output from previous day: 04/28 0701 - 04/29 0700 In: 1767 [P.O.:200; I.V.:1567] Out: 1800 [Urine:1800] Intake/Output this shift:    Physical exam:  Abdomen is soft nondistended nontympanitic minimally tender in both lower quadrants no peritoneal signs calves are nontender awake alert and oriented  Lab Results: CBC   Recent Labs  10/25/15 1155 10/26/15 0558  WBC 9.5 12.3*  HGB 15.3 13.9  HCT 44.7 42.0  PLT 251 204   BMET  Recent Labs  10/24/15 1706  NA 140  K 4.0  CL 105  CO2 24  GLUCOSE 117*  BUN 14  CREATININE 1.06  CALCIUM 9.2   PT/INR No results for input(s): LABPROT, INR in the last 72 hours. ABG No results for input(s): PHART, HCO3 in the last 72 hours.  Invalid input(s): PCO2, PO2  Studies/Results: Ct Abdomen Pelvis W Contrast  10/24/2015  CLINICAL DATA:  Right lower quadrant abdomen pain and nausea starting this morning. EXAM: CT ABDOMEN AND PELVIS WITH CONTRAST TECHNIQUE: Multidetector CT imaging of the abdomen and pelvis was performed using the standard protocol following bolus administration of intravenous contrast. CONTRAST:  75mL ISOVUE-300 IOPAMIDOL (ISOVUE-300) INJECTION 61% COMPARISON:  None. FINDINGS: Lower chest: No acute findings. There is no focal pneumonia or pleural effusion. The heart size is normal.  Hepatobiliary: There is mild diffuse low density of the liver. The gallbladder is normal. No masses or other significant abnormality. Pancreas: No mass, inflammatory changes, or other significant abnormality. Spleen: Within normal limits in size and appearance. Adrenals/Urinary Tract: The adrenal glands and kidneys are normal. The bladder is partially decompressed without gross abnormality. No masses identified. No evidence of hydronephrosis. Stomach/Bowel: Mild thick walled distal ileal small bowel loops with surrounding mesenteric stranding are identified. The appendix is normal. There is no small bowel obstruction or diverticulitis. There is a small hiatal hernia. Vascular/Lymphatic: No pathologically enlarged lymph nodes. No evidence of abdominal aortic aneurysm. Reproductive: No mass or other significant abnormality. Other: Small amount of free fluid is identified in the right pelvis. Musculoskeletal:  No suspicious bone lesions identified. IMPRESSION: Mild abnormal thick walled distal ileal small bowel loops with surrounding mesenteric stranding consistent with ileitis. Normal appendix. Mild fatty infiltration of liver. Electronically Signed   By: Sherian Rein M.D.   On: 10/24/2015 19:38    Anti-infectives: Anti-infectives    Start     Dose/Rate Route Frequency Ordered Stop   10/25/15 0800  ciprofloxacin (CIPRO) IVPB 400 mg     400 mg 200 mL/hr over 60 Minutes Intravenous Every 12 hours 10/24/15 2045     10/24/15 2100  metroNIDAZOLE (FLAGYL) IVPB 500 mg     500 mg 100 mL/hr over 60 Minutes Intravenous Every 8 hours 10/24/15 2045     10/24/15 2000  metroNIDAZOLE (FLAGYL) IVPB 500 mg     500 mg 100 mL/hr over 60 Minutes Intravenous  Once 10/24/15 1951 10/24/15 2101   10/24/15 2000  ciprofloxacin (CIPRO) IVPB 400 mg     400 mg 200 mL/hr over 60 Minutes Intravenous  Once 10/24/15 1951 10/24/15 2101      Assessment/Plan:  Enteritis of unclear etiology he does have a family history of Crohn's  disease but it is remote and he has never been treated for anything like this before. Suspect infectious ileitis and no sign of acute surgical process at this time we'll sign off but be available as necessary  Lattie Hawichard E Rossetta Kama, MD, Carilyn GoodpastureFACS  10/26/2015

## 2016-02-20 ENCOUNTER — Observation Stay (HOSPITAL_COMMUNITY)
Admission: EM | Admit: 2016-02-20 | Discharge: 2016-02-21 | Disposition: A | Discharge status: Home / Self Care | Payer: Managed Care, Other (non HMO) | Attending: General Surgery | Admitting: General Surgery

## 2016-02-20 ENCOUNTER — Encounter (HOSPITAL_COMMUNITY): Payer: Self-pay | Admitting: *Deleted

## 2016-02-20 ENCOUNTER — Emergency Department (HOSPITAL_COMMUNITY): Payer: Managed Care, Other (non HMO)

## 2016-02-20 DIAGNOSIS — F17290 Nicotine dependence, other tobacco product, uncomplicated: Secondary | ICD-10-CM | POA: Insufficient documentation

## 2016-02-20 DIAGNOSIS — Z6836 Body mass index (BMI) 36.0-36.9, adult: Secondary | ICD-10-CM | POA: Insufficient documentation

## 2016-02-20 DIAGNOSIS — R1011 Right upper quadrant pain: Secondary | ICD-10-CM

## 2016-02-20 DIAGNOSIS — R197 Diarrhea, unspecified: Secondary | ICD-10-CM | POA: Insufficient documentation

## 2016-02-20 DIAGNOSIS — E663 Overweight: Secondary | ICD-10-CM | POA: Diagnosis not present

## 2016-02-20 DIAGNOSIS — R1031 Right lower quadrant pain: Secondary | ICD-10-CM

## 2016-02-20 HISTORY — DX: Noninfective gastroenteritis and colitis, unspecified: K52.9

## 2016-02-20 HISTORY — DX: Right lower quadrant pain: R10.31

## 2016-02-20 LAB — URINALYSIS, ROUTINE W REFLEX MICROSCOPIC
Bilirubin Urine: NEGATIVE
GLUCOSE, UA: NEGATIVE mg/dL
HGB URINE DIPSTICK: NEGATIVE
KETONES UR: NEGATIVE mg/dL
Leukocytes, UA: NEGATIVE
Nitrite: NEGATIVE
PROTEIN: NEGATIVE mg/dL
SPECIFIC GRAVITY, URINE: 1.022 (ref 1.005–1.030)
pH: 6.5 (ref 5.0–8.0)

## 2016-02-20 LAB — CBC
HEMATOCRIT: 46.8 % (ref 39.0–52.0)
HEMOGLOBIN: 15.8 g/dL (ref 13.0–17.0)
MCH: 29.1 pg (ref 26.0–34.0)
MCHC: 33.8 g/dL (ref 30.0–36.0)
MCV: 86.2 fL (ref 78.0–100.0)
Platelets: 267 10*3/uL (ref 150–400)
RBC: 5.43 MIL/uL (ref 4.22–5.81)
RDW: 13 % (ref 11.5–15.5)
WBC: 6.9 10*3/uL (ref 4.0–10.5)

## 2016-02-20 LAB — COMPREHENSIVE METABOLIC PANEL
ALK PHOS: 79 U/L (ref 38–126)
ALT: 55 U/L (ref 17–63)
AST: 30 U/L (ref 15–41)
Albumin: 4.4 g/dL (ref 3.5–5.0)
Anion gap: 9 (ref 5–15)
BUN: 10 mg/dL (ref 6–20)
CHLORIDE: 103 mmol/L (ref 101–111)
CO2: 26 mmol/L (ref 22–32)
Calcium: 9.7 mg/dL (ref 8.9–10.3)
Creatinine, Ser: 1.05 mg/dL (ref 0.61–1.24)
GFR calc Af Amer: >60 mL/min (ref >60)
GFR calc non Af Amer: >60 mL/min (ref >60)
Glucose, Bld: 90 mg/dL (ref 65–99)
POTASSIUM: 3.9 mmol/L (ref 3.5–5.1)
SODIUM: 138 mmol/L (ref 135–145)
Total Bilirubin: 0.7 mg/dL (ref 0.3–1.2)
Total Protein: 7.8 g/dL (ref 6.5–8.1)

## 2016-02-20 LAB — LIPASE, BLOOD: LIPASE: 28 U/L (ref 11–51)

## 2016-02-20 LAB — FERRITIN: FERRITIN: 128 ng/mL (ref 24–336)

## 2016-02-20 LAB — VITAMIN B12: Vitamin B-12: 346 pg/mL (ref 180–914)

## 2016-02-20 LAB — C-REACTIVE PROTEIN: CRP: 0.6 mg/dL (ref <1.0)

## 2016-02-20 LAB — SEDIMENTATION RATE: Sed Rate: 5 mm/hr (ref 0–16)

## 2016-02-20 MED ORDER — DIPHENHYDRAMINE HCL 50 MG/ML IJ SOLN: 25.0000 mg | Freq: Four times a day (QID) | INTRAMUSCULAR | Status: DC | PRN

## 2016-02-20 MED ORDER — ONDANSETRON 4 MG PO TBDP: 4.0000 mg | ORAL_TABLET | Freq: Four times a day (QID) | ORAL | Status: DC | PRN

## 2016-02-20 MED ORDER — DIPHENHYDRAMINE HCL 25 MG PO CAPS: 25.0000 mg | ORAL_CAPSULE | Freq: Four times a day (QID) | ORAL | Status: DC | PRN

## 2016-02-20 MED ORDER — IOPAMIDOL (ISOVUE-300) INJECTION 61%
INTRAVENOUS | Status: AC
  Administered 2016-02-20: 100 mL
  Filled 2016-02-20: qty 100

## 2016-02-20 MED ORDER — HYDROCODONE-ACETAMINOPHEN 5-325 MG PO TABS: 1.0000 | ORAL_TABLET | ORAL | Status: DC | PRN

## 2016-02-20 MED ORDER — MORPHINE SULFATE (PF) 4 MG/ML IV SOLN: 4.0000 mg | Freq: Once | INTRAVENOUS | Status: DC

## 2016-02-20 MED ORDER — HYDROMORPHONE HCL 1 MG/ML IJ SOLN: 1.0000 mg | INTRAMUSCULAR | Status: DC | PRN

## 2016-02-20 MED ORDER — SODIUM CHLORIDE 0.9 % IV BOLUS (SEPSIS)
1000.0000 mL | Freq: Once | INTRAVENOUS | Status: AC
  Administered 2016-02-20: 1000 mL via INTRAVENOUS

## 2016-02-20 MED ORDER — PIPERACILLIN-TAZOBACTAM 3.375 G IVPB 30 MIN
3.3750 g | Freq: Once | INTRAVENOUS | Status: AC
  Administered 2016-02-20: 3.375 g via INTRAVENOUS
  Filled 2016-02-20: qty 50

## 2016-02-20 MED ORDER — ONDANSETRON HCL 4 MG/2ML IJ SOLN: 4.0000 mg | Freq: Four times a day (QID) | INTRAMUSCULAR | Status: DC | PRN

## 2016-02-20 MED ORDER — METRONIDAZOLE IN NACL 5-0.79 MG/ML-% IV SOLN
500.0000 mg | Freq: Three times a day (TID) | INTRAVENOUS | Status: DC
  Administered 2016-02-20 – 2016-02-21 (×3): 500 mg via INTRAVENOUS
  Filled 2016-02-20 (×6): qty 100

## 2016-02-20 MED ORDER — DEXTROSE 5 % IV SOLN
2.0000 g | INTRAVENOUS | Status: DC
  Administered 2016-02-20: 2 g via INTRAVENOUS
  Filled 2016-02-20 (×2): qty 2

## 2016-02-20 MED ORDER — SODIUM CHLORIDE 0.9 % IV SOLN
INTRAVENOUS | Status: DC
  Administered 2016-02-20 – 2016-02-21 (×2): via INTRAVENOUS

## 2016-02-20 NOTE — ED Notes (Signed)
Report attempted to 6N.  

## 2016-02-20 NOTE — ED Notes (Signed)
Report attempted again. 

## 2016-02-20 NOTE — ED Notes (Signed)
Report attempted x2

## 2016-02-20 NOTE — ED Notes (Signed)
Pt. And wife asking about plan of care. Surgical PA consulted and at bedside

## 2016-02-20 NOTE — ED Notes (Signed)
Pain assessed and pt. Refused morphine

## 2016-02-20 NOTE — ED Provider Notes (Signed)
[facility]-EMERGENCY DEPT Provider Note   CSN: [id number] Arrival date & time: 02/20/16  0903     History   Chief Complaint Chief Complaint  Patient presents with  . Abdominal Pain    HPI Christopher Mcdowell is a 30 y.o. male.  HPI   30 year old male presents today with complaints of abdominal pain. Patient has significant past history of ileitis, was admitted to [facility] and treated with antibiotics. He reports symptoms resolved and has had no complaints since then. Patient reports that over the last 2 weeks she's had off-and-on abdominal pain located over his umbilicus and right lower quadrant. Described the pain is an ache. He reports this morning symptoms significantly worsened with an episode of vomiting. Patient reports her last week she's had diarrhea, this week more regular bowel movements. Patient reports some nausea after eating, worsening pain after eating. Patient denies any recent antibiotics, reports no bloody stools, urinary normal.    Past Medical History:  Diagnosis Date  . Ileitis   . Medical history non-contributory     Patient Active Problem List   Diagnosis Date Noted  . Right lower quadrant pain 02/20/2016  . Right lower quadrant abdominal pain   . Diarrhea   . Ileitis 10/24/2015    History reviewed. No pertinent surgical history.    Home Medications    Prior to Admission medications   Medication Sig Start Date End Date Taking? Authorizing Provider  ciprofloxacin (CIPRO) 500 MG tablet Take 1 tablet (500 mg total) by mouth 2 (two) times daily. Patient not taking: Reported on 02/20/2016 10/26/15   Enedina Finner, MD  HYDROcodone-acetaminophen (NORCO/VICODIN) 5-325 MG tablet Take 1-2 tablets by mouth every 4 (four) hours as needed for moderate pain. Patient not taking: Reported on 02/20/2016 10/26/15   Enedina Finner, MD  metroNIDAZOLE (FLAGYL) 500 MG tablet Take 1 tablet (500 mg total) by mouth every 8 (eight) hours. Patient not taking: Reported on 02/20/2016  10/26/15   Enedina Finner, MD  ondansetron (ZOFRAN) 4 MG tablet Take 1 tablet (4 mg total) by mouth every 6 (six) hours as needed for nausea. Patient not taking: Reported on 02/20/2016 10/26/15   Enedina Finner, MD    Family History No family history on file.  Social History Social History  Substance Use Topics  . Smoking status: Passive Smoke Exposure - Never Smoker  . Smokeless tobacco: Current User  . Alcohol use Yes     Comment: occ     Allergies   Review of patient's allergies indicates no known allergies.   Review of Systems Review of Systems  All other systems reviewed and are negative.    Physical Exam Updated Vital Signs BP 126/82 (BP Location: Right Arm)   Pulse 61   Temp 97.8 F (36.6 C) (Oral)   Resp 18   Ht 6\' 1"  (1.854 m)   Wt 125.6 kg   SpO2 100%   BMI 36.52 kg/m   Physical Exam  Constitutional: He is oriented to person, place, and time. He appears well-developed and well-nourished.  HENT:  Head: Normocephalic and atraumatic.  Eyes: Conjunctivae are normal. Pupils are equal, round, and reactive to light. Right eye exhibits no discharge. Left eye exhibits no discharge. No scleral icterus.  Neck: Normal range of motion. No JVD present. No tracheal deviation present.  Pulmonary/Chest: Effort normal. No stridor.  Abdominal:  TTP of right lower quadrant   Neurological: He is alert and oriented to person, place, and time. Coordination normal.  Psychiatric: He has a  normal mood and affect. His behavior is normal. Judgment and thought content normal.  Nursing note and vitals reviewed.    ED Treatments / Results  Labs (all labs ordered are listed, but only abnormal results are displayed) Labs Reviewed  LIPASE, BLOOD  COMPREHENSIVE METABOLIC PANEL  CBC  URINALYSIS, ROUTINE W REFLEX MICROSCOPIC (NOT AT [facility])  SEDIMENTATION RATE  C-REACTIVE PROTEIN  FERRITIN  VITAMIN B12    EKG  EKG Interpretation None       Radiology Ct Abdomen Pelvis W  Contrast  Result Date: 02/20/2016 CLINICAL DATA:  Right-sided abdominal pain. Nausea. Occasional vomiting. EXAM: CT ABDOMEN AND PELVIS WITH CONTRAST TECHNIQUE: Multidetector CT imaging of the abdomen and pelvis was performed using the standard protocol following bolus administration of intravenous contrast. CONTRAST:  100mL ISOVUE-300 IOPAMIDOL (ISOVUE-300) INJECTION 61% COMPARISON:  CT abdomen pelvis 10/24/2015 FINDINGS: Lower chest:  No acute findings. Hepatobiliary: No masses or other significant abnormality. Pancreas: No mass, inflammatory changes, or other significant abnormality. Spleen: Within normal limits in size and appearance. Adrenals/Urinary Tract: No masses identified. No evidence of hydronephrosis. Stomach/Bowel: No evidence of obstruction. There is a short segment of bowel wall mucosal thickening and indistinctness in the right pelvis. An isolated gas bubble is seen within the area of mesenteric stranding anterior to the inflamed bowel loop, image 76/114, sequence 201, which may represent focal abscess formation or a micro rupture. These changes are within the area of the tip of the appendix and it is difficult to discern whether they represent an abnormal inflamed short segment of distal ileum or tip appendicitis. Vascular/Lymphatic: There is a string of mildly enlarged mesenteric lymph nodes in the right lower quadrant of the abdomen. Reproductive: No mass or other significant abnormality. Other: None. Musculoskeletal:  No suspicious bone lesions identified. IMPRESSION: Mucosal thickening and indistinctness of the wall of short segment of bowel in the right pelvis with extensive surrounding mesenteric inflammatory changes and a single gas bubble adjacent to the inflamed bowel, suggestive of micro rupture. These changes are within the area of the tip of the appendix and it is difficult to discern whether they represent an abnormal inflamed short segment of distal ileum or tip appendicitis.  Probably reactive right lower quadrant mesenteric lymphadenopathy. These results were called by telephone at the time of interpretation on 02/20/2016 at 12:29 pm to Caiden Arteaga , who verbally acknowledged these results. Electronically Signed   By: Ted Mcalpineobrinka  Dimitrova M.D.   On: 02/20/2016 12:29    Procedures Procedures (including critical care time)  Medications Ordered in ED Medications  morphine 4 MG/ML injection 4 mg (not administered)  cefTRIAXone (ROCEPHIN) 2 g in dextrose 5 % 50 mL IVPB (not administered)    And  metroNIDAZOLE (FLAGYL) IVPB 500 mg (not administered)  0.9 %  sodium chloride infusion (not administered)  HYDROcodone-acetaminophen (NORCO/VICODIN) 5-325 MG per tablet 1-2 tablet (not administered)  HYDROmorphone (DILAUDID) injection 1 mg (not administered)  diphenhydrAMINE (BENADRYL) capsule 25 mg (not administered)    Or  diphenhydrAMINE (BENADRYL) injection 25 mg (not administered)  ondansetron (ZOFRAN-ODT) disintegrating tablet 4 mg (not administered)    Or  ondansetron (ZOFRAN) injection 4 mg (not administered)  iopamidol (ISOVUE-300) 61 % injection (100 mLs  Contrast Given 02/20/16 1141)  sodium chloride 0.9 % bolus 1,000 mL (1,000 mLs Intravenous New Bag/Given 02/20/16 1342)  piperacillin-tazobactam (ZOSYN) IVPB 3.375 g (0 g Intravenous Stopped 02/20/16 1412)     Initial Impression / Assessment and Plan / ED Course  I have reviewed the triage  vital signs and the nursing notes.  Pertinent labs & imaging results that were available during my care of the patient were reviewed by me and considered in my medical decision making (see chart for details).  Clinical Course     Final Clinical Impressions(s) / ED Diagnoses   Final diagnoses:  RLQ abdominal pain  RUQ pain   Labs:Urinalysis, lipase, CMP, CBC  Imaging: CT abdomen and pelvis with contrast  Consults:  Therapeutics: Morphine, Zosyn  Discharge Meds:   Assessment/Plan: 31 year old male presents  today with abdominal pain. He has unremarkable laboratory analysis but concerning findings on his CT scan. Patient will need surgery consultation.  Surgery evaluated patient here in the emergency room, they will be admitting.  Patient has remained stable throughout his stay in the ED.     New Prescriptions New Prescriptions   No medications on file     Eyvonne Mechanic, PA-C 02/20/16 1532    Loren Racer, MD 02/26/16 [phone]

## 2016-02-20 NOTE — ED Triage Notes (Signed)
Pt states peri-umbilical pain and nausea x 1 week.  Tx  Reg in April for RLQ pain.

## 2016-02-20 NOTE — H&P (Signed)
[facility] Admission Note  Christopher Mcdowell Nov 05, 1985  [id number].    Requesting MD: Dr. Lita Mains Chief Complaint/Reason for Consult: RLQ pain HPI:   30 y/o male with a PMH gout and ileitis last admitted in 10/24/2015 for acute gastroenteritis/ileitis treated with cipro/flagyl who presents to [facility] with worsening abdominal pain, nausea, diarrhea, and vomiting. Abdominal pain is located around umbilicus and RLQ, described as constant and worse with eating. One episode of vomiting one week ago and one episode today that was "yellow". States that last week he was having watery, brown diarrhea up to 10 times daily, but the past two days he has had normal, formed BMs. Also reports intermittent gastric reflux. Denies fever, chills, CP, SOB, hematemesis, melena, or hematochezia. Denies recent travel or sick contacts. Denies association of symptoms with lactose/certain foods. States his grandfather had crohn's disease. Denies history of oral ulcers. When I asked about a history of arthritis he reports joint pain in his feet- particularly his right toe joint. Has been treated for gout in the past.  Denies a known family history of ulcerative colitis or colon cancer. Has never had any abdominal surgeries. Has never had a colonoscopy. Does not take any regular medications. Denies use of cigarettes. Uses chewing tobacco. Drinks socially.  Small hiatal hernia on CT 09/2015  ROS: All systems reviewed and otherwise negative except for as above  No family history on file.  Past Medical History:  Diagnosis Date  . Ileitis   . Medical history non-contributory     History reviewed. No pertinent surgical history.  Social History:  reports that he is a non-smoker but has been exposed to tobacco smoke. He uses smokeless tobacco. He reports that he drinks alcohol. He reports that he does not use drugs.  Allergies: No Known Allergies   (Not in a hospital admission)  Blood pressure 129/71, pulse  66, temperature 97.8 F (36.6 C), temperature source Oral, resp. rate 20, height 6' 1" (1.854 m), weight 125.6 kg (276 lb 12.8 oz), SpO2 98 %. Physical Exam: General: pleasant, overweight white male who is laying in bed in NAD HEENT: head is normocephalic, atraumatic.  Heart: regular, rate, and rhythm.  No obvious murmurs, gallops, or rubs noted.  Palpable pedal pulses bilaterally Lungs: CTAB, no wheezes, rhonchi, or rales noted.  Respiratory effort nonlabored Abd: soft, TTP RLQ, ND, +BS, no masses, hernias, or organomegaly. No peritonitis or guarding MS: all 4 extremities are symmetrical with no cyanosis, clubbing, or edema. Skin: warm and dry with no masses, lesions, or rashes Psych:  appropriate affect. Neuro: A&Ox3, normal speech  Results for orders placed or performed during the hospital encounter of 02/20/16 (from the past 48 hour(s))  Lipase, blood     Status: None   Collection Time: 02/20/16  9:12 AM  Result Value Ref Range   Lipase 28 11 - 51 U/L  Comprehensive metabolic panel     Status: None   Collection Time: 02/20/16  9:12 AM  Result Value Ref Range   Sodium 138 135 - 145 mmol/L   Potassium 3.9 3.5 - 5.1 mmol/L   Chloride 103 101 - 111 mmol/L   CO2 26 22 - 32 mmol/L   Glucose, Bld 90 65 - 99 mg/dL   BUN 10 6 - 20 mg/dL   Creatinine, Ser 1.05 0.61 - 1.24 mg/dL   Calcium 9.7 8.9 - 10.3 mg/dL   Total Protein 7.8 6.5 - 8.1 g/dL   Albumin 4.4 3.5 - 5.0 g/dL  AST 30 15 - 41 U/L   ALT 55 17 - 63 U/L   Alkaline Phosphatase 79 38 - 126 U/L   Total Bilirubin 0.7 0.3 - 1.2 mg/dL   GFR calc non Af Amer >60 >60 mL/min   GFR calc Af Amer >60 >60 mL/min    Comment: (NOTE) The eGFR has been calculated using the CKD EPI equation. This calculation has not been validated in all clinical situations. eGFR's persistently <60 mL/min signify possible Chronic Kidney Disease.    Anion gap 9 5 - 15  CBC     Status: None   Collection Time: 02/20/16  9:12 AM  Result Value Ref Range    WBC 6.9 4.0 - 10.5 K/uL   RBC 5.43 4.22 - 5.81 MIL/uL   Hemoglobin 15.8 13.0 - 17.0 g/dL   HCT 46.8 39.0 - 52.0 %   MCV 86.2 78.0 - 100.0 fL   MCH 29.1 26.0 - 34.0 pg   MCHC 33.8 30.0 - 36.0 g/dL   RDW 13.0 11.5 - 15.5 %   Platelets 267 150 - 400 K/uL  Urinalysis, Routine w reflex microscopic     Status: None   Collection Time: 02/20/16  9:25 AM  Result Value Ref Range   Color, Urine YELLOW YELLOW   APPearance CLEAR CLEAR   Specific Gravity, Urine 1.022 1.005 - 1.030   pH 6.5 5.0 - 8.0   Glucose, UA NEGATIVE NEGATIVE mg/dL   Hgb urine dipstick NEGATIVE NEGATIVE   Bilirubin Urine NEGATIVE NEGATIVE   Ketones, ur NEGATIVE NEGATIVE mg/dL   Protein, ur NEGATIVE NEGATIVE mg/dL   Nitrite NEGATIVE NEGATIVE   Leukocytes, UA NEGATIVE NEGATIVE    Comment: MICROSCOPIC NOT DONE ON URINES WITH NEGATIVE PROTEIN, BLOOD, LEUKOCYTES, NITRITE, OR GLUCOSE <1000 mg/dL.   Ct Abdomen Pelvis W Contrast  Result Date: 02/20/2016 CLINICAL DATA:  Right-sided abdominal pain. Nausea. Occasional vomiting. EXAM: CT ABDOMEN AND PELVIS WITH CONTRAST TECHNIQUE: Multidetector CT imaging of the abdomen and pelvis was performed using the standard protocol following bolus administration of intravenous contrast. CONTRAST:  125m ISOVUE-300 IOPAMIDOL (ISOVUE-300) INJECTION 61% COMPARISON:  CT abdomen pelvis 10/24/2015 FINDINGS: Lower chest:  No acute findings. Hepatobiliary: No masses or other significant abnormality. Pancreas: No mass, inflammatory changes, or other significant abnormality. Spleen: Within normal limits in size and appearance. Adrenals/Urinary Tract: No masses identified. No evidence of hydronephrosis. Stomach/Bowel: No evidence of obstruction. There is a short segment of bowel wall mucosal thickening and indistinctness in the right pelvis. An isolated gas bubble is seen within the area of mesenteric stranding anterior to the inflamed bowel loop, image 76/114, sequence 201, which may represent focal abscess  formation or a micro rupture. These changes are within the area of the tip of the appendix and it is difficult to discern whether they represent an abnormal inflamed short segment of distal ileum or tip appendicitis. Vascular/Lymphatic: There is a string of mildly enlarged mesenteric lymph nodes in the right lower quadrant of the abdomen. Reproductive: No mass or other significant abnormality. Other: None. Musculoskeletal:  No suspicious bone lesions identified. IMPRESSION: Mucosal thickening and indistinctness of the wall of short segment of bowel in the right pelvis with extensive surrounding mesenteric inflammatory changes and a single gas bubble adjacent to the inflamed bowel, suggestive of micro rupture. These changes are within the area of the tip of the appendix and it is difficult to discern whether they represent an abnormal inflamed short segment of distal ileum or tip appendicitis. Probably reactive right lower  quadrant mesenteric lymphadenopathy. These results were called by telephone at the time of interpretation on 02/20/2016 at 12:29 pm to [place] , who verbally acknowledged these results. Electronically Signed   By: Fidela Salisbury M.D.   On: 02/20/2016 12:29   Assessment/Plan Right lower quadrant pain  Ileitis, recurrent  Nausea - admit for IVF, pain control, antiemetics - treat empirically for early appendicitis - consult GI for further workup of inflammatory bowel symptoms  - labs ordered: iron, B12, ESR, CRP; CBC and BMP in AM  Diet: Regular, NPO after MN ID: Rocephin, flagyl  DVT Proph: [place], [facility] 02/20/2016, 2:26 PM Pager: [phone] Consults: [phone] Mon-Fri 7:00 am-4:30 pm Sat-Sun 7:00 am-11:30 am

## 2016-02-20 NOTE — ED Notes (Signed)
Report called and accepted by Leotis ShamesLauren RN

## 2016-02-20 NOTE — ED Notes (Signed)
Pt. Family asking about assessing the gallbladder

## 2016-02-21 DIAGNOSIS — R1031 Right lower quadrant pain: Secondary | ICD-10-CM

## 2016-02-21 DIAGNOSIS — R933 Abnormal findings on diagnostic imaging of other parts of digestive tract: Secondary | ICD-10-CM | POA: Diagnosis not present

## 2016-02-21 LAB — BASIC METABOLIC PANEL
ANION GAP: 11 (ref 5–15)
BUN: 8 mg/dL (ref 6–20)
CALCIUM: 9 mg/dL (ref 8.9–10.3)
CO2: 21 mmol/L — AB (ref 22–32)
Chloride: 107 mmol/L (ref 101–111)
Creatinine, Ser: 0.89 mg/dL (ref 0.61–1.24)
GFR calc non Af Amer: >60 mL/min (ref >60)
Glucose, Bld: 83 mg/dL (ref 65–99)
POTASSIUM: 3.8 mmol/L (ref 3.5–5.1)
Sodium: 139 mmol/L (ref 135–145)

## 2016-02-21 LAB — CBC WITH DIFFERENTIAL/PLATELET
BASOS ABS: 0 10*3/uL (ref 0.0–0.1)
BASOS PCT: 0 %
Eosinophils Absolute: 0.3 10*3/uL (ref 0.0–0.7)
Eosinophils Relative: 4 %
HEMATOCRIT: 45.5 % (ref 39.0–52.0)
HEMOGLOBIN: 15.1 g/dL (ref 13.0–17.0)
Lymphocytes Relative: 27 %
Lymphs Abs: 1.8 10*3/uL (ref 0.7–4.0)
MCH: 28.7 pg (ref 26.0–34.0)
MCHC: 33.2 g/dL (ref 30.0–36.0)
MCV: 86.5 fL (ref 78.0–100.0)
MONOS PCT: 10 %
Monocytes Absolute: 0.7 10*3/uL (ref 0.1–1.0)
NEUTROS ABS: 4 10*3/uL (ref 1.7–7.7)
NEUTROS PCT: 59 %
Platelets: 244 10*3/uL (ref 150–400)
RBC: 5.26 MIL/uL (ref 4.22–5.81)
RDW: 13 % (ref 11.5–15.5)
WBC: 6.8 10*3/uL (ref 4.0–10.5)

## 2016-02-21 MED ORDER — CIPROFLOXACIN HCL 500 MG PO TABS: 500.0000 mg | ORAL_TABLET | Freq: Two times a day (BID) | ORAL | 0 refills | Status: AC

## 2016-02-21 MED ORDER — METRONIDAZOLE 500 MG PO TABS: 500.0000 mg | ORAL_TABLET | Freq: Two times a day (BID) | ORAL | 0 refills | Status: AC

## 2016-02-21 MED ORDER — CIPROFLOXACIN HCL 500 MG PO TABS: 500.0000 mg | ORAL_TABLET | Freq: Two times a day (BID) | ORAL | Status: DC

## 2016-02-21 MED ORDER — METHOCARBAMOL 750 MG PO TABS: 750.0000 mg | ORAL_TABLET | Freq: Four times a day (QID) | ORAL | Status: DC | PRN

## 2016-02-21 MED ORDER — HYDROCODONE-ACETAMINOPHEN 5-325 MG PO TABS: 1.0000 | ORAL_TABLET | Freq: Four times a day (QID) | ORAL | 0 refills | Status: DC | PRN

## 2016-02-21 NOTE — Consult Note (Signed)
Consultation  Referring Provider: [facility] - Dr Francena HanlyKisinger Primary Care Physician:  No primary care provider on file. Primary Gastroenterologist:  None - saw Dr Kendrick Friesein  [place] last admit /no office follow up  Reason for Consultation:  RLQ pain  HPI: Christopher Mcdowell is a 30 y.o. male who was admitted through the emergency room last night after acute onset of what he describes as vomiting of bile yesterday afternoon. Patient is generally in good health with no known chronic medical problems. [facility] had an [facility] 2017 with right lower quadrant pain. At that time he had CT scan done which was consistent with an acute ileitis. He was seen by GI at that time Dr. Shelle Ironein, and felt to have an acute infectious ileitis. GI pathogen panel was done and returned positive for a sapovirus. Patient's symptoms completely resolved with a course of Cipro and Flagyl. He was to return to GI if he had any recurrent symptoms after he finished the antibiotics which she did not. He says he has been feeling fine ever since then until onset last week with some mild recurrent right lower quadrant pain. He says this may have been a 3-4 out of 10 and was not as severe as when he was hospitalized in April. This pain was coming and going throughout the day and seems to be exacerbated by eating area he had no associated fever or chills but did have diarrhea last week with up to 8 bowel movements per day of nonbloody stool. Says over the past couple of days the diarrhea had decreased that he felt a bit better until he had onset of vomiting yesterday. On admission labs fairly unremarkable WBC was normal, CRP and sedimentation rate within normal limits. CT of the abdomen and pelvis was repeated last night and shows mucosal thickening in indistinctness of the wall of a short segment of small bowel in the right pelvis with extensive surrounding mesenteric inflammatory changes and a single gas bubble  adjacent to the inflamed bowel suggestive of a micro-rupture these changes are within the area of the tip of the appendix and difficult to determine if this represents an abnormal short segment of distal ileum or tip appendicitis, also has some probable reactive right lower quadrant mesenteric lymphadenopathy.  Patient says he feels better today has not had any further nausea or vomiting and his pain is very minimal. He is hungry and would like to eat. He is being covered with IV Cipro and IV Flagyl. Past Medical History:  Diagnosis Date  . Ileitis   . Medical history non-contributory     History reviewed. No pertinent surgical history.  Prior to Admission medications   Medication Sig Start Date End Date Taking? Authorizing Provider  ciprofloxacin (CIPRO) 500 MG tablet Take 1 tablet (500 mg total) by mouth 2 (two) times daily. Patient not taking: Reported on 02/20/2016 10/26/15   Enedina FinnerSona Patel, MD  HYDROcodone-acetaminophen (NORCO/VICODIN) 5-325 MG tablet Take 1-2 tablets by mouth every 4 (four) hours as needed for moderate pain. Patient not taking: Reported on 02/20/2016 10/26/15   Enedina FinnerSona Patel, MD  metroNIDAZOLE (FLAGYL) 500 MG tablet Take 1 tablet (500 mg total) by mouth every 8 (eight) hours. Patient not taking: Reported on 02/20/2016 10/26/15   Enedina FinnerSona Patel, MD  ondansetron (ZOFRAN) 4 MG tablet Take 1 tablet (4 mg total) by mouth every 6 (six) hours as needed for nausea. Patient not taking: Reported on 02/20/2016 10/26/15   Enedina FinnerSona Patel, MD  Current Facility-Administered Medications  Medication Dose Route Frequency Provider Last Rate Last Dose  . 0.9 %  sodium chloride infusion   Intravenous Continuous Adam Phenix, PA-C 75 mL/hr at 02/21/16 1610    . cefTRIAXone (ROCEPHIN) 2 g in dextrose 5 % 50 mL IVPB  2 g Intravenous Q24H Adam Phenix, PA-C 100 mL/hr at 02/20/16 1548 2 g at 02/20/16 1548   And  . metroNIDAZOLE (FLAGYL) IVPB 500 mg  500 mg Intravenous Q8H Francine Graven Simaan, PA-C  100 mL/hr at 02/21/16 0649 500 mg at 02/21/16 0649  . diphenhydrAMINE (BENADRYL) capsule 25 mg  25 mg Oral Q6H PRN Francine Graven Simaan, PA-C       Or  . diphenhydrAMINE (BENADRYL) injection 25 mg  25 mg Intravenous Q6H PRN Adam Phenix, PA-C      . HYDROcodone-acetaminophen (NORCO/VICODIN) 5-325 MG per tablet 1-2 tablet  1-2 tablet Oral Q4H PRN Adam Phenix, PA-C      . HYDROmorphone (DILAUDID) injection 1 mg  1 mg Intravenous Q2H PRN Francine Graven Simaan, PA-C      . morphine 4 MG/ML injection 4 mg  4 mg Intravenous Once [employer], PA-C      . ondansetron (ZOFRAN-ODT) disintegrating tablet 4 mg  4 mg Oral Q6H PRN Adam Phenix, PA-C       Or  . ondansetron [facility]) injection 4 mg  4 mg Intravenous Q6H PRN Adam Phenix, PA-C        Allergies as of 02/20/2016  . (No Known Allergies)    No family history on file.  Social History   Social History  . Marital status: Married    Spouse name: N/A  . Number of children: N/A  . Years of education: N/A   Occupational History  . Not on file.   Social History Main Topics  . Smoking status: Passive Smoke Exposure - Never Smoker  . Smokeless tobacco: Current User  . Alcohol use Yes     Comment: occ  . Drug use: No  . Sexual activity: Yes   Other Topics Concern  . Not on file   Social History Narrative  . No narrative on file    Review of Systems: Pertinent positive and negative review of systems were noted in the above HPI section.  All other review of systems was otherwise negative.  Physical Exam: Vital signs in last 24 hours: Temp:  [97.8 F (36.6 C)-98.9 F (37.2 C)] 97.8 F (36.6 C) (08/25 0434) Pulse Rate:  [61-80] 66 (08/25 0434) Resp:  [16-18] 16 (08/25 0434) BP: (118-126)/(59-82) 119/59 (08/25 0434) SpO2:  [98 %-100 %] 98 % (08/25 0434) Last BM Date: 02/20/16   General:   Alert, well-developed, well-nourished,young white male  pleasant and cooperative in NAD, walking around the  room Head:  Normocephalic and atraumatic. Eyes:  Sclera clear, no icterus.   Conjunctiva pink. Ears:  Normal auditory acuity. Nose:  No deformity, discharge,  or lesions. Mouth:  No deformity or lesions.   Neck:  Supple; no masses or thyromegaly. Lungs:  Clear throughout to auscultation.   No wheezes, crackles, or rhonchi.  Heart:  Regular rate and rhythm; no murmurs, clicks, rubs,  or gallops. Abdomen:  Soft,nontender, BS active,nonpalp mass or hsm.   Rectal:  Deferred  Msk:  Symmetrical without gross deformities. . Pulses:  Normal pulses noted. Extremities:  Without clubbing or edema. Neurologic:  Alert and  oriented x4;  grossly normal neurologically. Skin:  Intact without significant  lesions or rashes.. Psych:  Alert and cooperative. Normal mood and affect.  Intake/Output from previous day: 08/24 0701 - 08/25 0700 In: 1210 [P.O.:360; I.V.:750; IV Piggyback:100] Out: 775 [Urine:775] Intake/Output this shift: Total I/O In: 340 [I.V.:190; IV Piggyback:150] Out: -   Lab Results:  Recent Labs  02/20/16 0912 02/21/16 0558  WBC 6.9 6.8  HGB 15.8 15.1  HCT 46.8 45.5  PLT 267 244   BMET  Recent Labs  02/20/16 0912 02/21/16 0558  NA 138 139  K 3.9 3.8  CL 103 107  CO2 26 21*  GLUCOSE 90 83  BUN 10 8  CREATININE 1.05 0.89  CALCIUM 9.7 9.0   LFT  Recent Labs  02/20/16 0912  PROT 7.8  ALBUMIN 4.4  AST 30  ALT 55  ALKPHOS 79  BILITOT 0.7    IMPRESSION:  #55  30 year old white male with brief hospitalization in April 2017 with what was felt to be an infectious ileitis symptomatically resolved with a course of Cipro and Flagyl and patient felt well until last week when he had recurrence of right lower quadrant pain which has not been severe. He had vomiting yesterday precipitating ER evaluation and CT consistent with a short segment of distal ileitis versus tip appendicitis with extensive surrounding mesenteric inflammatory changes and a single gas bubble  adjacent to the inflamed bowel suggestive of a micro-rupture. Symptoms have markedly improved overnight and he has no significant abdominal tenderness, no nausea and is hungry. Suspect that he does have an underlying inflammatory bowel disease/Crohn's ileitis   PLAN: Continue Cipro and Flagyl and convert to oral Cipro 500 twice a day and Flagyl 500 by mouth twice a day on discharge to complete a 10-14 day course of antibiotics total Start full liquid diet and advance to soft low roughage diet as tolerates Hesitant to start steroids in setting of possible micro-rupture We will plan to have him complete a course of antibiotics have outpatient follow-up and a colonoscopy. Pt desires to have GI follow up with Dr. Valaria Good in [place].    Amy Esterwood  02/21/2016, 11:47 AM      Attending physician's note   I have taken a history, examined the patient and reviewed the chart. I agree with the Advanced Practitioner's note, impression and recommendations. Recurrent ileitis concerning for Crohn's disease, less likely an infectious ileitis or appendicitis. Complete a 7-10 day course of Cipro and Flagyl. Advance gradually to a low residue diet until he has GI follow. Discharge and outpatient mgmt would be appropriate. Pt plans to follow up with Dr. Joanna Puff in [place] for all further GI care.    Claudette Head, MD Clementeen Graham [phone] Mon-Fri 8a-5p [phone] after 5p, weekends, holidays

## 2016-02-21 NOTE — Discharge Instructions (Signed)
1. GRADUALLY advance your diet from full liquids to soft foods to a low residue diet. 2. Take the full 10 day course of antibiotics 3. Schedule an appointment with your gastroenterologist in 2 weeks for a brief exam and scheduling of your colonoscopy. 4. Call [facility] with any questions or concerns.  Low-Fiber Diet Fiber is found in fruits, vegetables, and whole grains. A low-fiber diet restricts fibrous foods that are not digested in the small intestine. A diet containing about 10-15 grams of fiber per day is considered low fiber. Low-fiber diets may be used to:  Promote healing and rest the bowel during intestinal flare-ups.  Prevent blockage of a partially obstructed or narrowed gastrointestinal tract.  Reduce fecal weight and volume.  Slow the movement of feces. You may be on a low-fiber diet as a transitional diet following surgery, after an injury (trauma), or because of a short (acute) or lifelong (chronic) illness. Your health care provider will determine the length of time you need to stay on this diet.  WHAT DO I NEED TO KNOW ABOUT A LOW-FIBER DIET? Always check the fiber content on the packaging's Nutrition Facts label, especially on foods from the grains list. Ask your dietitian if you have questions about specific foods that are related to your condition, especially if the food is not listed below. In general, a low-fiber food will have less than 2 g of fiber. WHAT FOODS CAN I EAT? Grains All breads and crackers made with white flour. Sweet rolls, doughnuts, waffles, pancakes, [place] toast, bagels. Pretzels, Melba toast, zwieback. Well-cooked cereals, such as cornmeal, farina, or cream cereals. Dry cereals that do not contain whole grains, fruit, or nuts, such as refined corn, wheat, rice, and oat cereals. Potatoes prepared any way without skins, plain pastas and noodles, refined white rice. Use white flour for baking and making sauces. Use allowed list of grains for casseroles,  dumplings, and puddings.  Vegetables Strained tomato and vegetable juices. Fresh lettuce, cucumber, spinach. Well-cooked (no skin or pulp) or canned vegetables, such as asparagus, bean sprouts, beets, carrots, green beans, mushrooms, potatoes, pumpkin, spinach, yellow squash, tomato sauce/puree, turnips, yams, and zucchini. Keep servings limited to  cup.  Fruits All fruit juices except prune juice. Cooked or canned fruits without skin and seeds, such as applesauce, apricots, cherries, fruit cocktail, grapefruit, grapes, mandarin oranges, melons, peaches, pears, pineapple, and plums. Fresh fruits without skin, such as apricots, avocados, bananas, melons, pineapple, nectarines, and peaches. Keep servings limited to  cup or 1 piece.  Meat and Other Protein Sources Ground or well-cooked tender beef, ham, veal, lamb, pork, or poultry. Eggs, plain cheese. Fish, oysters, shrimp, lobster, and other seafood. Liver, organ meats. Smooth nut butters. Dairy All milk products and alternative dairy substitutes, such as soy, rice, almond, and coconut, not containing added whole nuts, seeds, or added fruit. Beverages Decaf coffee, fruit, and vegetable juices or smoothies (small amounts, with no pulp or skins, and with fruits from allowed list), sports drinks, herbal tea. Condiments Ketchup, mustard, vinegar, cream sauce, cheese sauce, cocoa powder. Spices in moderation, such as allspice, basil, bay leaves, celery powder or leaves, cinnamon, cumin powder, curry powder, ginger, mace, marjoram, onion or garlic powder, oregano, paprika, parsley flakes, ground pepper, rosemary, sage, savory, tarragon, thyme, and turmeric. Sweets and Desserts Plain cakes and cookies, pie made with allowed fruit, pudding, custard, cream pie. Gelatin, fruit, ice, sherbet, frozen ice pops. Ice cream, ice milk without nuts. Plain hard candy, honey, jelly, molasses, syrup, sugar, chocolate syrup,  gumdrops, marshmallows. Limit overall sugar  intake.  Fats and Oil Margarine, butter, cream, mayonnaise, salad oils, plain salad dressings made from allowed foods. Choose healthy fats such as olive oil, canola oil, and omega-3 fatty acids (such as found in salmon or tuna) when possible.  Other Bouillon, broth, or cream soups made from allowed foods. Any strained soup. Casseroles or mixed dishes made with allowed foods. The items listed above may not be a complete list of recommended foods or beverages. Contact your dietitian for more options.  WHAT FOODS ARE NOT RECOMMENDED? Grains All whole wheat and whole grain breads and crackers. Multigrains, rye, bran seeds, nuts, or coconut. Cereals containing whole grains, multigrains, bran, coconut, nuts, raisins. Cooked or dry oatmeal, steel-cut oats. Coarse wheat cereals, granola. Cereals advertised as high fiber. Potato skins. Whole grain pasta, wild or brown rice. Popcorn. Coconut flour. Bran, buckwheat, corn bread, multigrains, rye, wheat germ.  Vegetables Fresh, cooked or canned vegetables, such as artichokes, asparagus, beet greens, broccoli, Brussels sprouts, cabbage, celery, cauliflower, corn, eggplant, kale, legumes or beans, okra, peas, and tomatoes. Avoid large servings of any vegetables, especially raw vegetables.  Fruits Fresh fruits, such as apples with or without skin, berries, cherries, figs, grapes, grapefruit, guavas, kiwis, mangoes, oranges, papayas, pears, persimmons, pineapple, and pomegranate. Prune juice and juices with pulp, stewed or dried prunes. Dried fruits, dates, raisins. Fruit seeds or skins. Avoid large servings of all fresh fruits. Meats and Other Protein Sources Tough, fibrous meats with gristle. Chunky nut butter. Cheese made with seeds, nuts, or other foods not recommended. Nuts, seeds, legumes (beans, including baked beans), dried peas, beans, lentils.  Dairy Yogurt or cheese that contains nuts, seeds, or added fruit.  Beverages Fruit juices with high pulp, prune  juice. Caffeinated coffee and teas.  Condiments Coconut, maple syrup, pickles, olives. Sweets and Desserts Desserts, cookies, or candies that contain nuts or coconut, chunky peanut butter, dried fruits. Jams, preserves with seeds, marmalade. Large amounts of sugar and sweets. Any other dessert made with fruits from the not recommended list.  Other Soups made from vegetables that are not recommended or that contain other foods not recommended.  The items listed above may not be a complete list of foods and beverages to avoid. Contact your dietitian for more information.   This information is not intended to replace advice given to you by your health care provider. Make sure you discuss any questions you have with your health care provider.   Document Released: 12/05/2001 Document Revised: 06/20/2013 Document Reviewed: 05/08/2013 Elsevier Interactive Patient Education [employer]

## 2016-02-21 NOTE — Progress Notes (Signed)
Still having some intermittent pain, nausea.  Vitals:   02/20/16 2036 02/21/16 0434  BP: 118/73 (!) 119/59  Pulse: 74 66  Resp: 16 16  Temp: 98.2 F (36.8 C) 97.8 F (36.6 C)    CBC Latest Ref Rng & Units 02/21/2016 02/20/2016 10/26/2015  WBC 4.0 - 10.5 K/uL 6.8 6.9 12.3(H)  Hemoglobin 13.0 - 17.0 g/dL 15.1 15.8 13.9  Hematocrit 39.0 - 52.0 % 45.5 46.8 42.0  Platelets 150 - 400 K/uL 244 267 204   BMP Latest Ref Rng & Units 02/21/2016 02/20/2016 10/24/2015  Glucose 65 - 99 mg/dL 83 90 117(H)  BUN 6 - 20 mg/dL '8 10 14  '$ Creatinine 0.61 - 1.24 mg/dL 0.89 1.05 1.06  Sodium 135 - 145 mmol/L 139 138 140  Potassium 3.5 - 5.1 mmol/L 3.8 3.9 4.0  Chloride 101 - 111 mmol/L 107 103 105  CO2 22 - 32 mmol/L 21(L) 26 24  Calcium 8.9 - 10.3 mg/dL 9.0 9.7 9.2   CRP, ESR, ferritin wnl  Aox3 Unlabored resp Abdomen soft, nondistended, mild RLQ TTP Ext warm  A/P: recurrent Rlq pain with concern for IBD. Continue empiric abx and NPO, f/u GI recommendations.

## 2016-02-21 NOTE — Progress Notes (Signed)
Patient and wife stated they're very unhappy that they did not see a Dr by Chana Bode9am today, they were told in the ED they would see a Dr by that time today.  At 17930, wife spoke with RN, RN explained that there is no guaranteed time as to when Dr's make their rounds due to the OR schedule and Emergencies. RN also agreed to speak with Surgery PA as to when they're going to see the patient since they had not come by yet.  After conversation with RN, the wife called the switchboard, requesting to report 6 [place].  Charge RN spoke with wife, also explaining that there is no guaranteed time as to when Dr's make their rounds due to the ever-changing OR schedule and Emergencies. The Surgery PA spoke with wife and patient shortly after, explaining that a GI consult was needed for recommendations. Due to the nature of the situation, the Surgery PA got GI to come see the patient very quickly.  GI agreed to a diet and PO antibiotics with discharge later in the day.  At 1230, RN spoke with Dietary, requesting that his tray please be expedited.  At 2pm, wife arrived and patient had not received his tray yet.  Teacher, early years/preAssistant Director RN and RN called Dietary to check on the status of his tray.  Wife requested to speak with "someone higher up", RN suggested the ChiropodistAssistant Director RN who was present at the time, wife declined and stated "I want a Dr in this room now".  RN spoke with Surgery PA, who arrived quickly and discharged the patient with medications.

## 2016-02-25 NOTE — Discharge Summary (Signed)
[facility] Discharge Summary   Patient ID: Christopher MaxcyDerrick G Mcdowell MRN: [id number] DOB/AGE: 29/09/1985 28 y.o.  Admit date: 02/20/2016 Discharge date: 02/25/2016  Admitting Diagnosis: Right lower quadrant pain  Discharge Diagnosis Patient Active Problem List   Diagnosis Date Noted  . Right lower quadrant pain 02/20/2016  . Right lower quadrant abdominal pain   . Diarrhea   . Ileitis 10/24/2015    Consultants Gastroenterology - Dr. Meryl DareMalcolm T Stark  Imaging: CT ABD/PELVIS W/ CONTRAST (02/20/16) - Mucosal thickening and indistinctness of the wall of short segment of bowel in the right pelvis with extensive surrounding mesenteric inflammatory changes and a single gas bubble adjacent to the inflamed bowel, suggestive of micro rupture. These changes are within the area of the tip of the appendix and it is difficult to discern whether they represent an abnormal inflamed short segment of distal ileum or tip appendicitis.  Procedures none  Hospital Course:  30 y/o male with PMH gout and ileitis [facility] pain associated with nausea, diarrhea, and vomiting. Abdominal pain is worse when he eats.  CT scan significant for above. No leukocytosis. Patient was admitted for antibiotics, monitoring and GI consult with a working diagnosis of early appendicitis vs ileitis concerning for inflammatory bowel disease. Gastroenterology consulted the patient and agreed that he may have findings concerning for crohn's disease. On hospital day #1 the patient was voiding well, tolerating diet, ambulating well, pain well controlled, vital signs stable, and felt stable for discharge home.  Patient will follow up with a gastroenterologist in the outpatient setting for further evaluation and scheduling of a colonoscopy.    Medication List    STOP taking these medications   ondansetron 4 MG tablet Commonly known as:  ZOFRAN     TAKE these medications   ciprofloxacin 500 MG  tablet Commonly known as:  CIPRO Take 1 tablet (500 mg total) by mouth 2 (two) times daily.   HYDROcodone-acetaminophen 5-325 MG tablet Commonly known as:  NORCO/VICODIN Take 1-2 tablets by mouth every 6 (six) hours as needed for moderate pain. What changed:  when to take this   metroNIDAZOLE 500 MG tablet Commonly known as:  FLAGYL Take 1 tablet (500 mg total) by mouth 2 (two) times daily with a meal. DO NOT CONSUME ALCOHOL WHILE TAKING THIS MEDICATION. What changed:  when to take this  additional instructions       Follow-up Information    [facility] .   Specialty:  General Surgery Why:  As needed Contact information: [address] [place] [id number] [phone]          Signed: Hosie SpangleElizabeth Simaan, [facility] 02/25/2016, 3:46 PM Pager: [phone] Consults: [phone] Mon-Fri 7:00 am-4:30 pm Sat-Sun 7:00 am-11:30 am

## 2017-01-13 IMAGING — CT CT ABD-PELV W/ CM
2 of 4 series · 8 of 46 positions shown, 9 images · IV contrast (Iodine)
Comparison: CT abdomen pelvis 10/24/2015

CLINICAL DATA: Right-sided abdominal pain. Nausea. Occasional
vomiting.

EXAM:
CT ABDOMEN AND PELVIS WITH CONTRAST
TECHNIQUE: Multidetector CT imaging of the abdomen and pelvis was performed
using the standard protocol following bolus administration of
intravenous contrast.
CONTRAST:  100mL 1PLZNN-911 IOPAMIDOL (1PLZNN-911) INJECTION 61%

[Series 201: routine, idose (2) · axial · 0.81mm/px · z∈[+93,+513]mm · 5 of 114 slices shown, 6 images]
[im 15/114  soft-tissue]
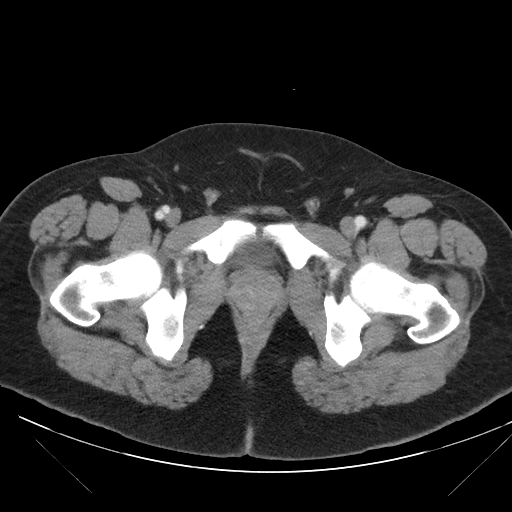
[im 15/114  bone]
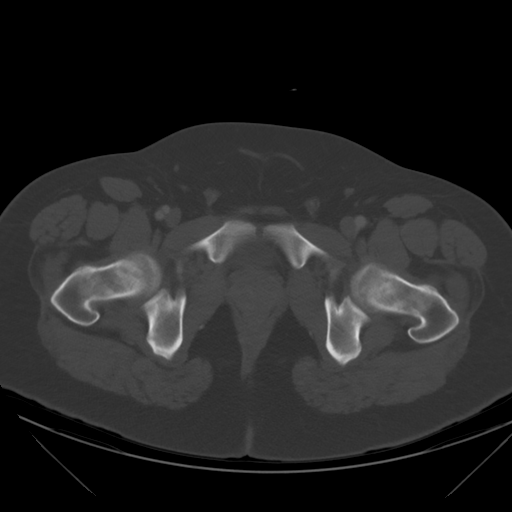
[im 35/114  soft-tissue]
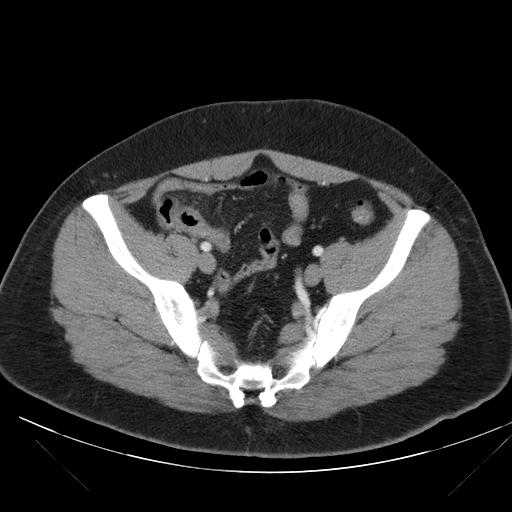
[im 59/114  soft-tissue]
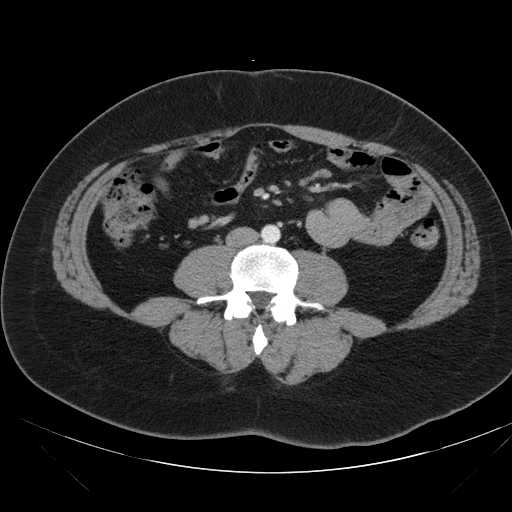
[im 79/114  soft-tissue]
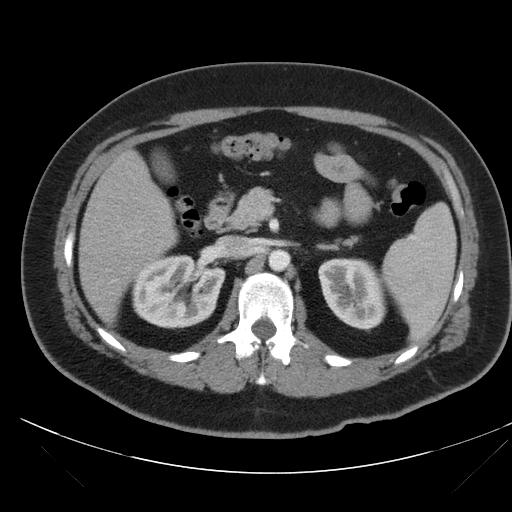
[im 99/114  soft-tissue]
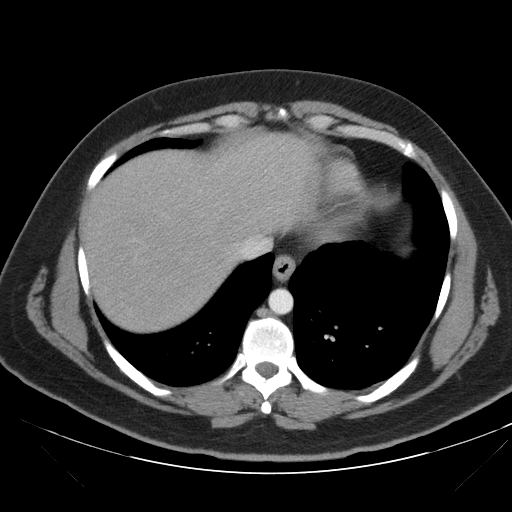

[Series 203: coronals, idose (2) · coronal · 0.45mm/px · 3 of 145 slices shown]
[im 49/145  soft-tissue]
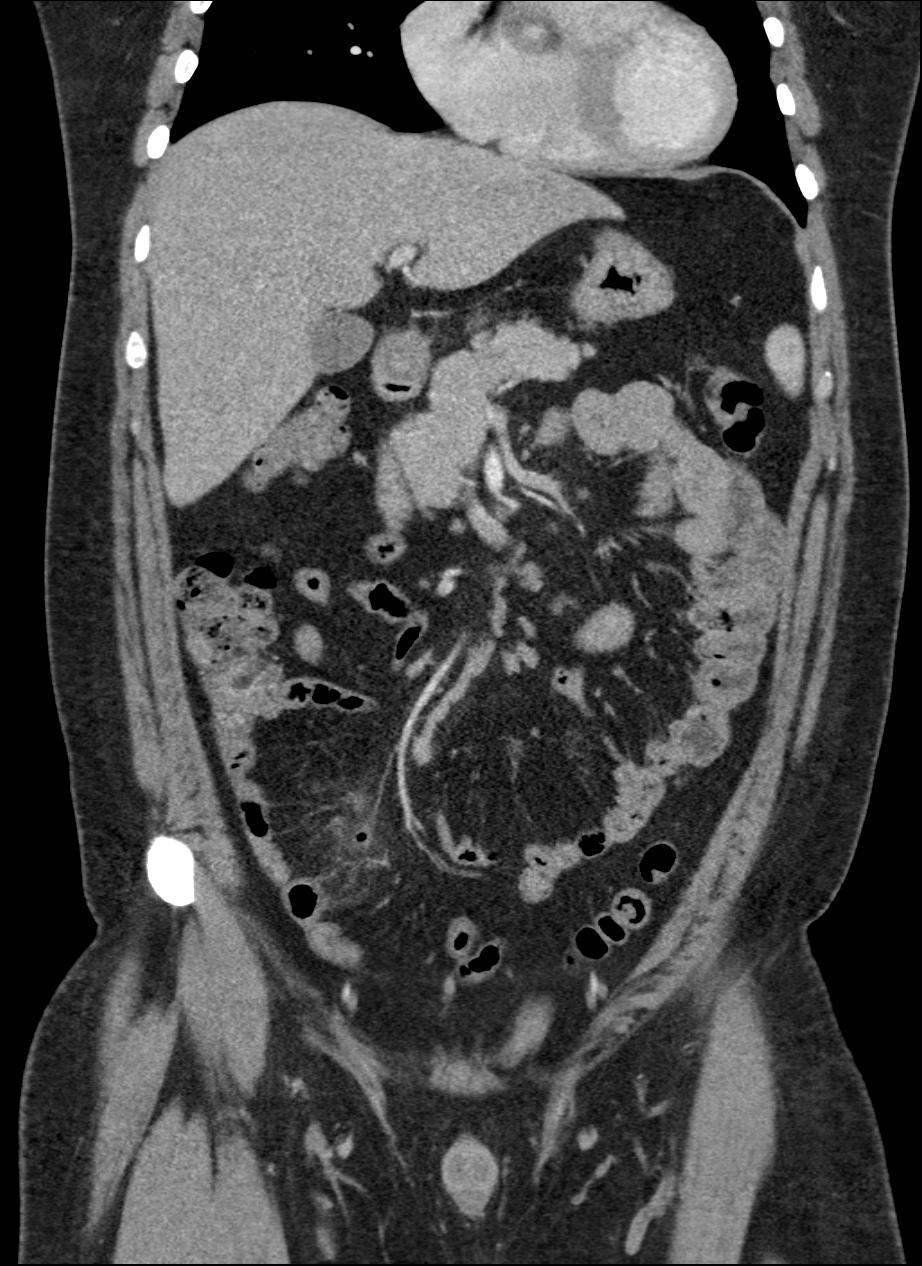
[im 65/145  soft-tissue]
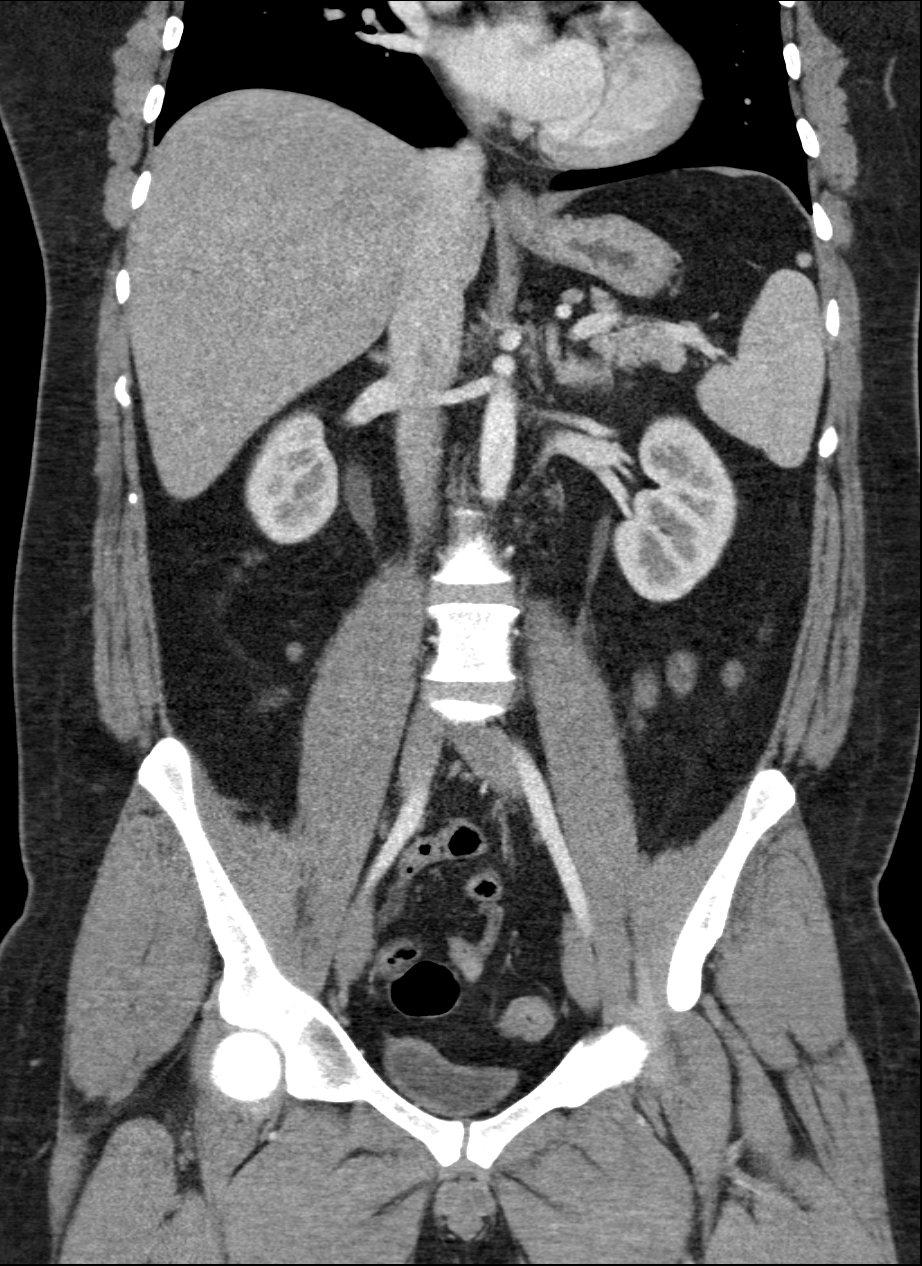
[im 81/145  soft-tissue]
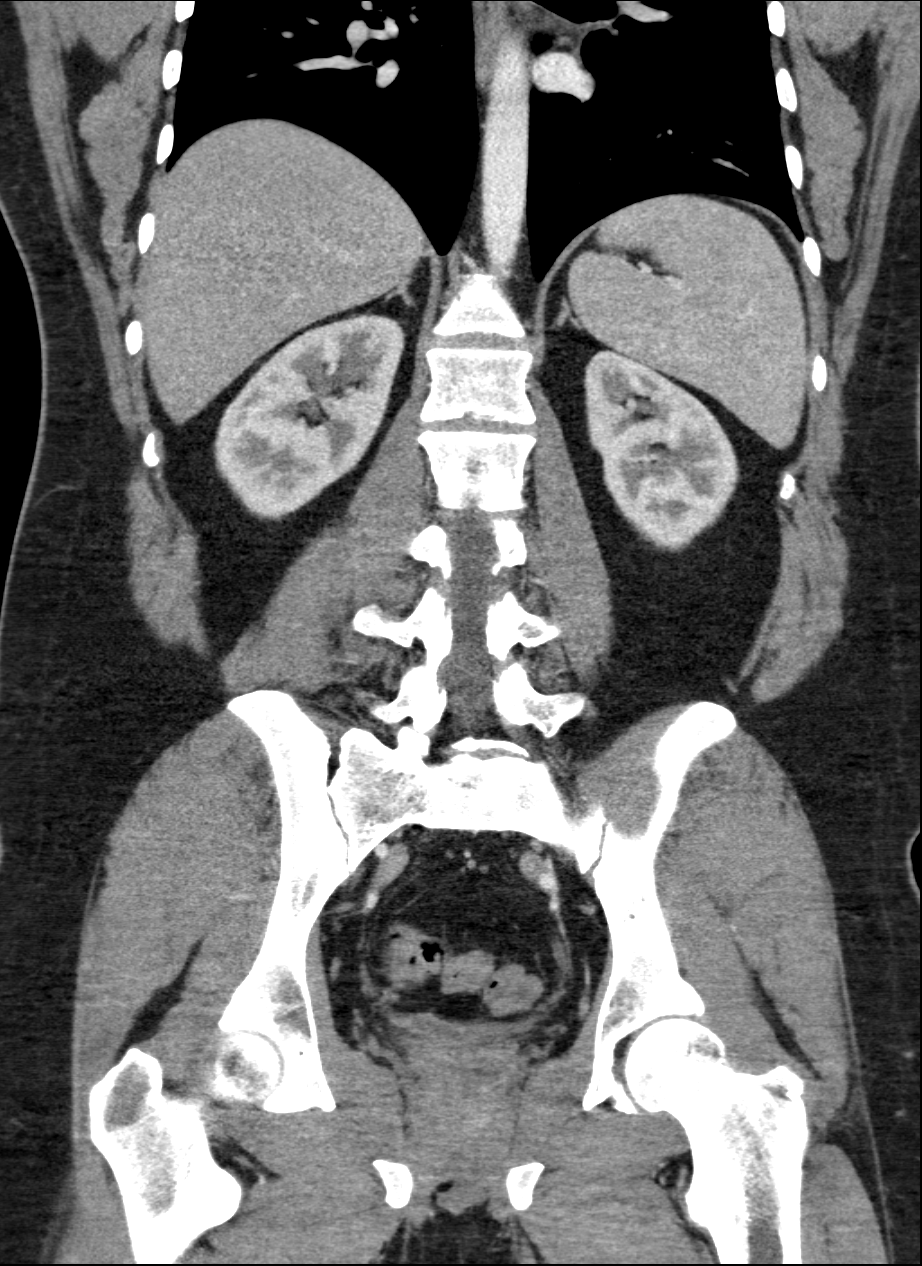

[8 of 46 positions shown; findings below may reference images not displayed]

FINDINGS: Lower chest:  No acute findings.

Hepatobiliary: No masses or other significant abnormality.

Pancreas: No mass, inflammatory changes, or other significant
abnormality.

Spleen: Within normal limits in size and appearance.

Adrenals/Urinary Tract: No masses identified. No evidence of
hydronephrosis.

Stomach/Bowel: No evidence of obstruction. There is a short segment
of bowel wall mucosal thickening and indistinctness in the right
pelvis. An isolated gas bubble is seen within the area of mesenteric
stranding anterior to the inflamed bowel loop, image 76/114,
sequence 201, which may represent focal abscess formation or a micro
rupture. These changes are within the area of the tip of the
appendix and it is difficult to discern whether they represent an
abnormal inflamed short segment of distal ileum or tip appendicitis.

Vascular/Lymphatic: There is a string of mildly enlarged mesenteric
lymph nodes in the right lower quadrant of the abdomen.

Reproductive: No mass or other significant abnormality.

Other: None.

Musculoskeletal:  No suspicious bone lesions identified.
IMPRESSION: Mucosal thickening and indistinctness of the wall of short segment
of bowel in the right pelvis with extensive surrounding mesenteric
inflammatory changes and a single gas bubble adjacent to the
inflamed bowel, suggestive of micro rupture. These changes are
within the area of the tip of the appendix and it is difficult to
discern whether they represent an abnormal inflamed short segment of
distal ileum or tip appendicitis.

Probably reactive right lower quadrant mesenteric lymphadenopathy.

These results were called by telephone at the time of interpretation
on 02/20/2016 at [DATE] to BERLINE VERA , who verbally
acknowledged these results.

## 2018-10-04 ENCOUNTER — Other Ambulatory Visit: Payer: Self-pay | Admitting: Family Medicine

## 2018-10-04 ENCOUNTER — Other Ambulatory Visit: Payer: Self-pay

## 2018-10-04 ENCOUNTER — Ambulatory Visit: Payer: Self-pay

## 2018-10-04 DIAGNOSIS — M79644 Pain in right finger(s): Secondary | ICD-10-CM

## 2019-08-28 IMAGING — DX RIGHT THUMB 2+V
3 series · 3 of 3 positions shown · non-contrast
Comparison: None.

CLINICAL DATA: Hyperextended right thumb 09/03/2018; persistent pain
right thumb.

EXAM:
RIGHT THUMB 2+V

[finger pa]
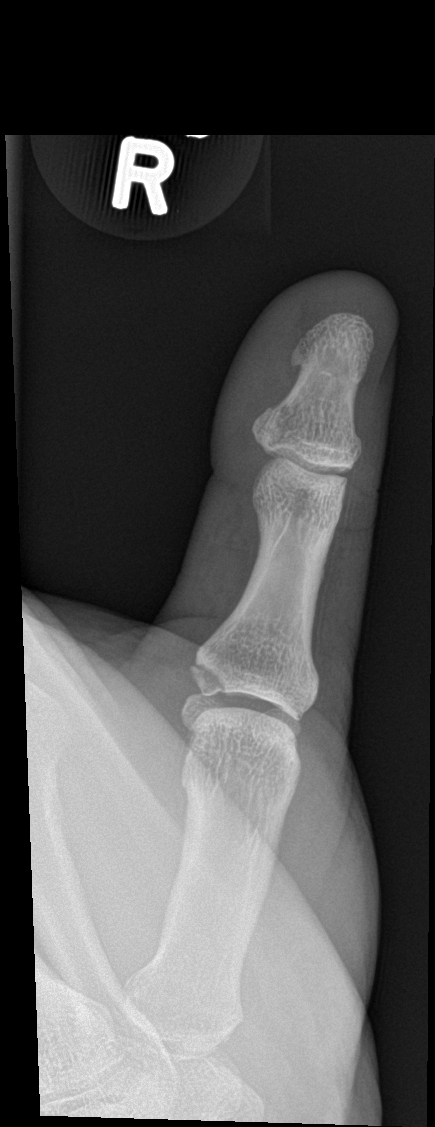

[finger obl]
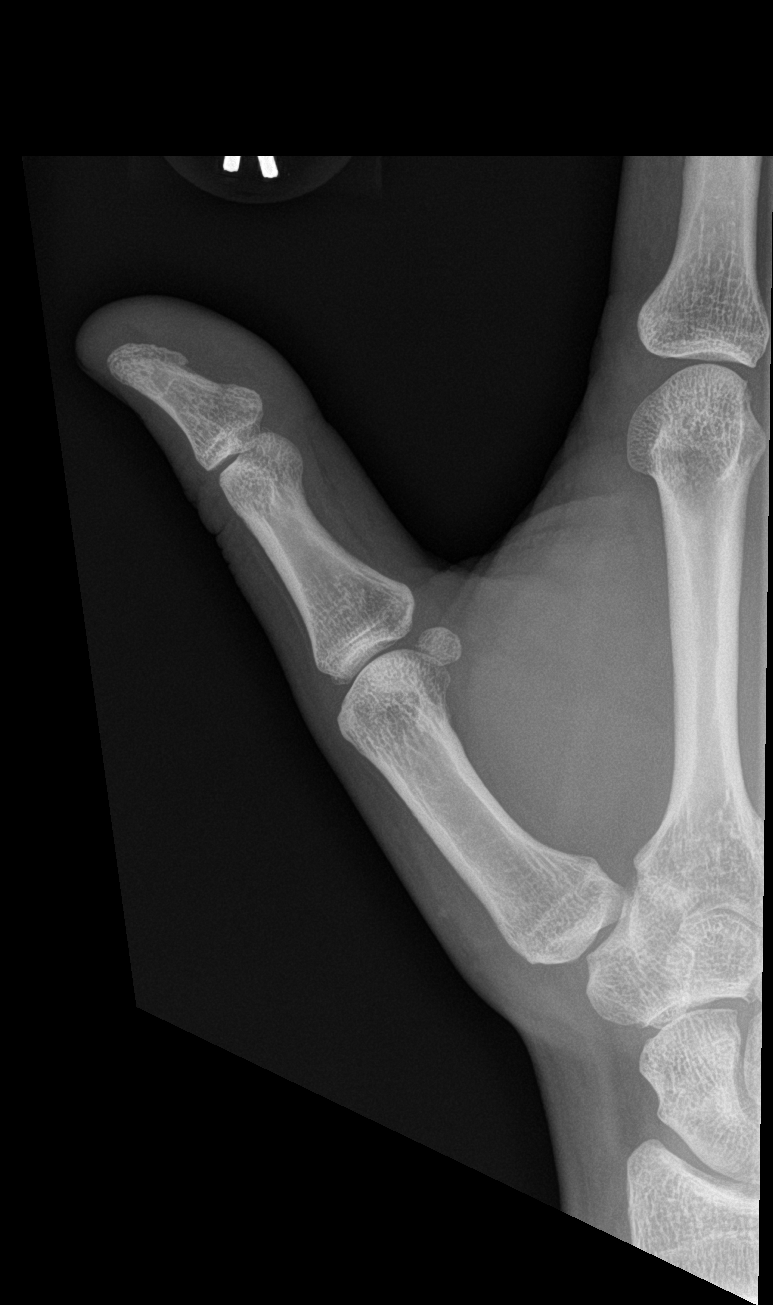

[finger lat]
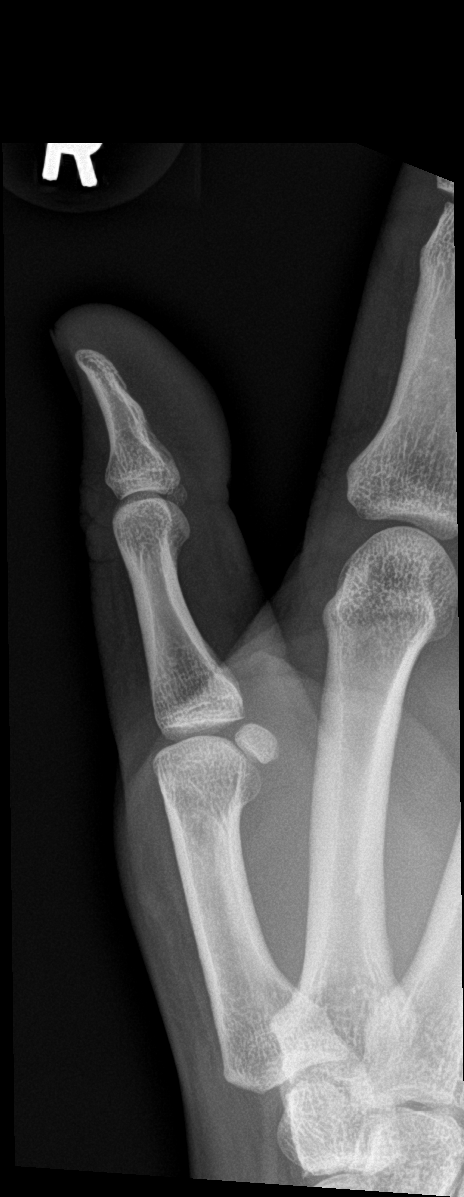

[3 of 3 positions shown; findings below may reference images not displayed]

FINDINGS: There is a fracture of the opposing or medial side of the base of
the proximal phalanx of the right thumb, non comminuted and non
displaced, involving the medial margin of the first MCP articular
surface. This is consistent with an avulsion fracture at the
insertion of the ulnar collateral ligament.
IMPRESSION: Avulsion fracture from the medial/ulnar base of the proximal phalanx
of the right thumb

## 2019-10-05 DIAGNOSIS — U071 COVID-19: Secondary | ICD-10-CM | POA: Insufficient documentation

## 2024-06-01 DIAGNOSIS — Z789 Other specified health status: Secondary | ICD-10-CM | POA: Insufficient documentation

## 2024-06-02 ENCOUNTER — Ambulatory Visit

## 2024-06-02 VITALS — BP 148/92 | HR 81 | Ht 73.0 in | Wt 303.0 lb

## 2024-06-02 DIAGNOSIS — R002 Palpitations: Secondary | ICD-10-CM

## 2024-06-02 DIAGNOSIS — R030 Elevated blood-pressure reading, without diagnosis of hypertension: Secondary | ICD-10-CM | POA: Insufficient documentation

## 2024-06-02 DIAGNOSIS — I10 Essential (primary) hypertension: Secondary | ICD-10-CM

## 2024-06-02 HISTORY — DX: Essential (primary) hypertension: I10

## 2024-06-02 HISTORY — DX: Palpitations: R00.2

## 2024-06-02 HISTORY — DX: Elevated blood-pressure reading, without diagnosis of hypertension: R03.0

## 2024-06-02 NOTE — Assessment & Plan Note (Signed)
 Monitor blood pressures at home. If blood pressures consistently above 130/80 mmHg would recommend antihypertensive regimen.

## 2024-06-02 NOTE — Assessment & Plan Note (Signed)
 Infrequent symptoms and short lasting. No danger signs such as syncope or near syncope. No obvious triggers.  Will proceed with Zio patch monitor for 14 days. Potential triggers such as caffeinated drinks, dehydration, sleep apnea were discussed.  Advised to have undergo further evaluation for sleep apnea since his reported history of snoring and body phenotype suggests sleep apnea-like behavior.  Would recommend regular exercise moderate intensity 5 times a week to help target weight loss and regular cardiac conditioning.

## 2024-06-02 NOTE — Progress Notes (Signed)
 Cardiology Consultation:    Date:  06/02/2024   ID:  Christopher Mcdowell, DOB 1985/10/10, MRN [id number]  PCP:  Sabas Norleen PARAS., MD  Cardiologist:  Alean SAUNDERS Atiya Yera, MD   Referring MD: Sabas Norleen PARAS., MD   No chief complaint on file.    ASSESSMENT AND PLAN:   Mr Capozzi 38 year old male with history of obesity, hyperlipidemia, palpitations, sleep apnea-like behavior.  Problem List Items Addressed This Visit     Palpitations - Primary   Infrequent symptoms and short lasting. No danger signs such as syncope or near syncope. No obvious triggers.  Will proceed with Zio patch monitor for 14 days. Potential triggers such as caffeinated drinks, dehydration, sleep apnea were discussed.  Advised to have undergo further evaluation for sleep apnea since his reported history of snoring and body phenotype suggests sleep apnea-like behavior.  Would recommend regular exercise moderate intensity 5 times a week to help target weight loss and regular cardiac conditioning.      Relevant Orders   EKG 12-Lead (Completed)   ECHOCARDIOGRAM COMPLETE   Elevated blood pressure reading in office without diagnosis of hypertension   Monitor blood pressures at home. If blood pressures consistently above 130/80 mmHg would recommend antihypertensive regimen.       Relevant Orders   ECHOCARDIOGRAM COMPLETE   Return to clinic tentatively in 2 months.   History of Present Illness:    Christopher Mcdowell is a 38 y.o. male who is being seen today for the evaluation of palpitations at the request of [place], Norleen PARAS., MD at [facility] in [place].  Obesity, hyperlipidemia, palpitations.  Pleasant man here for the visit by himself.  Lives with his family at home.  Works for a museum/gallery conservator.  Reports symptoms of palpitations that he is describes as a skipped beat or flutter like sensation can last for few seconds and occurs occasionally.  Infrequent episodes in the past several  months, last episode was over a month ago.  No obvious trigger.  Does notice symptoms were around the days he was dehydrated.  No other significant symptoms such as lightheadedness, dizziness or syncopal episodes.  Does feel a sense of shortness of breath during the brief episode.  Notably he was similarly symptomatic when he consumed energy drinks in the past and discontinued them.  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  No significant weight changes. Does snore at night.  No prior sleep apnea evaluation.  No history of hypertension.  Blood pressures today in the office elevated.  does not smoke. Drinks alcohol socially no heavy consumption. No illicit drug use. Drinks coffee multiple times a day. Does not consume energy drinks  EKG in the clinic today shows sinus rhythm heart rate 81/min, PR interval 146 ms, QRS duration 92 ms RSR prime pattern in lead V1, QTc 446 ms.  No ischemic changes  Blood work from 05/31/2024 shows hemoglobin 16.5, hematocrit 50.8, platelets 285, WBC 6.9 Lipid panel total cholesterol 156, triglycerides 216, HDL 31 and LDL 88. In comparison to prior lipid panel 04/09/2023  total cholesterol 134, triglycerides 184, HDL 28 and LDL 75.  TSH was normal 1.9.  Prior blood work from 04/09/2023: ALT mildly elevated 55 [similarly elevated in the past 79 in August 2023 and 53 in February 2023]  AST normal 30, alkaline phosphatase 92.    Past Medical History:  Diagnosis Date   COVID-19  10/05/2019   Diarrhea    Ileitis    Medical history non-contributory    Right lower quadrant abdominal pain    Right lower quadrant pain 02/20/2016    Past Surgical History:  Procedure Laterality Date   APPENDECTOMY     BOWEL RESECTION  2017   PILONIDAL CYST EXCISION      Current Medications: Current Meds  Medication Sig   atorvastatin (LIPITOR) 10 MG tablet Take 5 mg by mouth daily.      Allergies:   Patient has no known allergies.   Social History   Socioeconomic History   Marital status: Married    Spouse name: Not on file   Number of children: Not on file   Years of education: Not on file   Highest education level: Not on file  Occupational History   Not on file  Tobacco Use   Smoking status: Passive Smoke Exposure - Never Smoker   Smokeless tobacco: Current  Substance and Sexual Activity   Alcohol use: Yes    Comment: occ   Drug use: No   Sexual activity: Yes  Other Topics Concern   Not on file  Social History Narrative   Not on file   Social Drivers of Health   Financial Resource Strain: Not on file  Food Insecurity: Not on file  Transportation Needs: Not on file  Physical Activity: Not on file  Stress: Not on file  Social Connections: Not on file     Family History: The patient's family history includes Hyperlipidemia in his maternal grandfather, maternal grandmother, and mother; Hypertension in his maternal grandfather, maternal grandmother, and mother. ROS:   Please see the history of present illness.    All 14 point review of systems negative except as described per history of present illness.  EKGs/Labs/Other Studies Reviewed:    The following studies were reviewed today:   EKG:  EKG Interpretation Date/Time:  Friday June 02 2024 08:10:51 EST Ventricular Rate:  81 PR Interval:  146 QRS Duration:  92 QT Interval:  384 QTC Calculation: 446 R Axis:   81  Text Interpretation: Normal sinus rhythm No previous ECGs available Confirmed by Liborio Hai reddy [phone]) on 06/02/2024 8:15:22 AM    Recent Labs: No results found for requested labs within last 365 days.  Recent Lipid Panel No results found for: CHOL, TRIG, HDL, CHOLHDL, VLDL, LDLCALC, LDLDIRECT  Physical Exam:    VS:  BP (!) 148/92   Pulse 81   Ht 6' 1 (1.854 m)   Wt (!) 303 lb (137.4 kg)   SpO2 99%   BMI 39.98 kg/m     Wt Readings from  Last 3 Encounters:  06/02/24 (!) 303 lb (137.4 kg)  02/20/16 276 lb 12.8 oz (125.6 kg)  10/24/15 295 lb (133.8 kg)     GENERAL:  Well nourished, well developed in no acute distress NECK: No JVD; No carotid bruits CARDIAC: RRR, S1 and S2 present, no murmurs, no rubs, no gallops CHEST:  Clear to auscultation without rales, wheezing or rhonchi  Extremities: No pitting pedal edema. Pulses bilaterally symmetric with radial 2+ and dorsalis pedis 2+ NEUROLOGIC:  Alert and oriented x 3  Medication Adjustments/Labs and Tests Ordered: Current medicines are reviewed at length with the patient today.  Concerns regarding medicines are outlined above.  Orders Placed This Encounter  Procedures   EKG 12-Lead   ECHOCARDIOGRAM COMPLETE   No orders of the defined types were placed in this encounter.   Signed, Hai  reddy Ramona Ruark, MD, MPH, [facility]. 06/02/2024 8:39 AM    [facility]

## 2024-06-02 NOTE — Patient Instructions (Addendum)
 Medication Instructions:  Your physician recommends that you continue on your current medications as directed. Please refer to the Current Medication list given to you today.  *If you need a refill on your cardiac medications before your next appointment, please call your pharmacy*  Lab Work: None If you have labs (blood work) drawn today and your tests are completely normal, you will receive your results only by: MyChart Message (if you have MyChart) OR A paper copy in the mail If you have any lab test that is abnormal or we need to change your treatment, we will call you to review the results.  Testing/Procedures: Your physician has requested that you have an echocardiogram. Echocardiography is a painless test that uses sound waves to create images of your heart. It provides your doctor with information about the size and shape of your heart and how well your heart's chambers and valves are working. This procedure takes approximately one hour. There are no restrictions for this procedure. Please do NOT wear cologne, perfume, aftershave, or lotions (deodorant is allowed). Please arrive 15 minutes prior to your appointment time.  Please note: We ask at that you not bring children with you during ultrasound (echo/ vascular) testing. Due to room size and safety concerns, children are not allowed in the ultrasound rooms during exams. Our front office staff cannot provide observation of children in our lobby area while testing is being conducted. An adult accompanying a patient to their appointment will only be allowed in the ultrasound room at the discretion of the ultrasound technician under special circumstances. We apologize for any inconvenience.   Follow-Up: At [facility], you and your health needs are our priority.  As part of our continuing mission to provide you with exceptional heart care, our providers are all part of one team.  This team includes your primary Cardiologist  (physician) and Advanced Practice Providers or APPs (Physician Assistants and Nurse Practitioners) who all work together to provide you with the care you need, when you need it.  Your next appointment:   2 month(s)  Provider:   Alean Kobus, MD    We recommend signing up for the patient portal called MyChart.  Sign up information is provided on this After Visit Summary.  MyChart is used to connect with patients for Virtual Visits (Telemedicine).  Patients are able to view lab/test results, encounter notes, upcoming appointments, etc.  Non-urgent messages can be sent to your provider as well.   To learn more about what you can do with MyChart, go to [email or web address].   Other Instructions Patient to check with his work to see if he can wear a heart monitor at his job. If you can please call the office to set - up a monitor visit for a 2 week zio hear monitor.  Please keep a BP log for 2 weeks and send by MyChart or mail.                      Dr. Kobus [address] [place], [place] [address]  Blood Pressure Record Sheet To take your blood pressure, you will need a blood pressure machine. You can buy a blood pressure machine (blood pressure monitor) at your clinic, drug store, or online. When choosing one, consider: An automatic monitor that has an arm cuff. A cuff that wraps snugly around your upper arm. You should be able to fit only one finger between your arm and the cuff. A device that stores blood  pressure reading results. Do not choose a monitor that measures your blood pressure from your wrist or finger. Follow your health care provider's instructions for how to take your blood pressure. To use this form: Get one reading in the morning (a.m.) 1-2 hours after you take any medicines. Get one reading in the evening (p.m.) before supper.   Blood pressure log Date: _______________________  a.m. _____________________(1st reading) HR___________            p.m.  _____________________(2nd reading) HR__________  Date: _______________________  a.m. _____________________(1st reading) HR___________            p.m. _____________________(2nd reading) HR__________  Date: _______________________  a.m. _____________________(1st reading) HR___________            p.m. _____________________(2nd reading) HR__________  Date: _______________________  a.m. _____________________(1st reading) HR___________            p.m. _____________________(2nd reading) HR__________  Date: _______________________  a.m. _____________________(1st reading) HR___________            p.m. _____________________(2nd reading) HR__________  Date: _______________________  a.m. _____________________(1st reading) HR___________            p.m. _____________________(2nd reading) HR__________  Date: _______________________  a.m. _____________________(1st reading) HR___________            p.m. _____________________(2nd reading) HR__________   This information is not intended to replace advice given to you by your health care provider. Make sure you discuss any questions you have with your health care provider. Document Revised: 10/04/2019 Document Reviewed: 10/04/2019 Elsevier Patient Education  2021 [employer].

## 2024-07-05 ENCOUNTER — Telehealth: Payer: Self-pay

## 2024-07-05 ENCOUNTER — Other Ambulatory Visit: Payer: Self-pay | Admitting: Emergency Medicine

## 2024-07-05 DIAGNOSIS — R002 Palpitations: Secondary | ICD-10-CM

## 2024-07-05 NOTE — Telephone Encounter (Signed)
 Pt calling to see if he can come to the office to have his Zio monitor put on. Please advise.

## 2024-07-07 ENCOUNTER — Ambulatory Visit: Attending: Cardiology

## 2024-07-07 DIAGNOSIS — R002 Palpitations: Secondary | ICD-10-CM

## 2024-07-28 ENCOUNTER — Ambulatory Visit: Payer: Self-pay

## 2024-07-28 DIAGNOSIS — R002 Palpitations: Secondary | ICD-10-CM | POA: Diagnosis not present

## 2024-07-28 DIAGNOSIS — R030 Elevated blood-pressure reading, without diagnosis of hypertension: Secondary | ICD-10-CM

## 2024-07-28 LAB — ECHOCARDIOGRAM COMPLETE
Area-P 1/2: 3.85 cm2
S' Lateral: 3 cm

## 2024-08-04 ENCOUNTER — Ambulatory Visit: Payer: Self-pay

## 2024-08-04 VITALS — BP 152/94 | HR 92 | Resp 97 | Ht 73.0 in | Wt 307.1 lb

## 2024-08-04 DIAGNOSIS — G4739 Other sleep apnea: Secondary | ICD-10-CM | POA: Insufficient documentation

## 2024-08-04 DIAGNOSIS — I10 Essential (primary) hypertension: Secondary | ICD-10-CM

## 2024-08-04 DIAGNOSIS — R002 Palpitations: Secondary | ICD-10-CM

## 2024-08-04 DIAGNOSIS — E785 Hyperlipidemia, unspecified: Secondary | ICD-10-CM | POA: Insufficient documentation

## 2024-08-04 DIAGNOSIS — E782 Mixed hyperlipidemia: Secondary | ICD-10-CM

## 2024-08-04 MED ORDER — AMLODIPINE BESYLATE 2.5 MG PO TABS: 2.5000 mg | ORAL_TABLET | Freq: Every day | ORAL | 3 refills | Status: AC

## 2024-08-04 NOTE — Assessment & Plan Note (Signed)
 Infrequent episodes. No significant abnormalities on 12-day Zio patch from 07/07/2024. 1 brief run of SVT lasting 4 beats, asymptomatic. Tracings associated with triggered symptoms and diary entries correlated with normal heart rate and rhythm and isolated ventricular and supraventricular ectopic beats.  Reassuring results. Advised to avoid stimulants.

## 2024-08-04 NOTE — Assessment & Plan Note (Signed)
 Elevated blood pressures at office visits and readings at home also mildly elevated.  Target blood pressure below 130/80 mmHg. Discussed with him medications for blood pressure control. Reviewed calcium channel blocker amlodipine , mechanism of action, potential side effects. Agreeable to start.  Will send prescription for amlodipine  2.5 mg once daily.

## 2024-08-04 NOTE — Progress Notes (Signed)
 "  Cardiology Consultation:    Date:  08/04/2024   ID:  Christopher Mcdowell, DOB March 16, 1986, MRN [id number]  PCP:  Christopher Norleen PARAS., MD  Cardiologist:  Christopher SAUNDERS Karis Rilling, MD   Referring MD: Christopher Norleen PARAS., MD   No chief complaint on file.    ASSESSMENT AND PLAN:   Christopher Mcdowell 39 year old history of obesity, hyperlipidemia, sleep apnea-like behavior, elevated blood pressure without diagnosis of hypertension, palpitations. Occasional social alcohol consumption.  Consumes coffee multiple drinks a day. Echocardiogram completed 07-28-2024 noted normal biventricular function with LVEF 60 to 65%, normal diastolic function, mild concentric left ventricular hypertrophy, no significant valve abnormality. 12-day Zio patch monitor from 07/07/2024 noted sinus rhythm with average heart rate 83/min, rare ventricular and supraventricular ectopic beats, 1 short run of SVT lasting 4 beats, patient triggered events and diary entries total symptom, correlated with predominantly normal rate and rhythm and at times with isolated ventricular and supraventricular ectopic beats.    Problem List Items Addressed This Visit       Cardiovascular and Mediastinum   Hypertension - Primary   Elevated blood pressures at office visits and readings at home also mildly elevated.  Target blood pressure below 130/80 mmHg. Discussed with him medications for blood pressure control. Reviewed calcium channel blocker amlodipine , mechanism of action, potential side effects. Agreeable to start.  Will send prescription for amlodipine  2.5 mg once daily.       Relevant Medications   amLODipine  (NORVASC ) 2.5 MG tablet     Other   Palpitations   Infrequent episodes. No significant abnormalities on 12-day Zio patch from 07/07/2024. 1 brief run of SVT lasting 4 beats, asymptomatic. Tracings associated with triggered symptoms and diary entries correlated with normal heart rate and rhythm and isolated ventricular and  supraventricular ectopic beats.  Reassuring results. Advised to avoid stimulants.       Sleep apnea-like behavior   Recommended further evaluation for sleep apnea through his PCPs office. Role of treated sleep apnea and high blood pressure and ectopic beats reviewed.  Also recommended regular exercise to target weight loss.        Hyperlipidemia   Blood work from 05/31/2024 shows hemoglobin 16.5, hematocrit 50.8, platelets 285, WBC 6.9 Lipid panel total cholesterol 156, triglycerides 216, HDL 31 and LDL 88.   Hyperlipidemia management as per primary care provider.  Currently on atorvastatin 5 mg once daily.       Relevant Medications   amLODipine  (NORVASC ) 2.5 MG tablet      History of Present Illness:    Christopher Mcdowell is a 39 y.o. male who is being seen today for follow-up visit. PCP is Slatosky, Norleen PARAS., MD.  Last visit with me in the office was 06/02/2024.  Lives with his family at home, works for a museum/gallery conservator. 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  He has a trip coming up in March to [place] for couple weeks.  Has history of obesity, hyperlipidemia, sleep apnea-like behavior, elevated blood pressure without diagnosis of hypertension, palpitations. Occasional social alcohol consumption.  Consumes coffee multiple drinks a day. Echocardiogram completed 07-28-2024 noted normal biventricular function with LVEF 60 to 65%, normal diastolic function, mild concentric left ventricular hypertrophy, no significant valve abnormality. 12-day Zio patch monitor from 07/07/2024 noted sinus rhythm with average heart rate 83/min, rare ventricular and supraventricular ectopic beats, 1 short run of SVT lasting 4 beats, patient triggered events  and diary entries total symptom, correlated with predominantly normal rate and rhythm and at times with isolated ventricular and supraventricular ectopic beats.  No  further active symptoms at this time.  Once in a while brief episodes of palpitations. Blood pressure readings at home typically systolic up to 140s and diastolic between upper 70s to 19d.  Has cut down his caffeine intake.  Past Medical History:  Diagnosis Date   COVID-19 10/05/2019   Diarrhea    Elevated blood pressure reading in office without diagnosis of hypertension 06/02/2024   Hypertension 06/02/2024   Ileitis    Medical history non-contributory    Palpitations 06/02/2024   Right lower quadrant abdominal pain    Right lower quadrant pain 02/20/2016    Past Surgical History:  Procedure Laterality Date   APPENDECTOMY     BOWEL RESECTION  2017   PILONIDAL CYST EXCISION      Current Medications: Active Medications[1]   Allergies:   Patient has no known allergies.   Social History   Socioeconomic History   Marital status: Married    Spouse name: Not on file   Number of children: Not on file   Years of education: Not on file   Highest education level: Bachelor's degree (e.g., BA, AB, BS)  Occupational History   Not on file  Tobacco Use   Smoking status: Never    Passive exposure: Yes   Smokeless tobacco: Current  Substance and Sexual Activity   Alcohol use: Yes    Comment: occ   Drug use: No   Sexual activity: Yes  Other Topics Concern   Not on file  Social History Narrative   Not on file   Social Drivers of Health   Tobacco Use: High Risk (08/04/2024)   Patient History    Smoking Tobacco Use: Never    Smokeless Tobacco Use: Current    Passive Exposure: Yes  Financial Resource Strain: Low Risk (08/03/2024)   Overall Financial Resource Strain (CARDIA)    Difficulty of Paying Living Expenses: Not hard at all  Food Insecurity: No Food Insecurity (08/03/2024)   Epic    Worried About Radiation Protection Practitioner of Food in the Last Year: Never true    Ran Out of Food in the Last Year: Never true  Transportation Needs: No Transportation Needs (08/03/2024)   Epic    Lack of  Transportation (Medical): No    Lack of Transportation (Non-Medical): No  Physical Activity: Insufficiently Active (08/03/2024)   Exercise Vital Sign    Days of Exercise per Week: 1 day    Minutes of Exercise per Session: 20 min  Stress: No Stress Concern Present (08/03/2024)   [employer] of Occupational Health - Occupational Stress Questionnaire    Feeling of Stress: Only a little  Social Connections: Socially Integrated (08/03/2024)   Social Connection and Isolation Panel    Frequency of Communication with Friends and Family: More than three times a week    Frequency of Social Gatherings with Friends and Family: Once a week    Attends Religious Services: More than 4 times per year    Active Member of Clubs or Organizations: Yes    Attends Banker Meetings: More than 4 times per year    Marital Status: Married  Depression (EYV7-0): Not on file  Alcohol Screen: Low Risk (08/03/2024)   Alcohol Screen    Last Alcohol Screening Score (AUDIT): 3  Housing: Low Risk (08/03/2024)   Epic    Unable to Pay for Housing  in the Last Year: No    Number of Times Moved in the Last Year: 0    Homeless in the Last Year: No  Utilities: Not on file  Health Literacy: Not on file     Family History: The patient's family history includes Hyperlipidemia in his maternal grandfather, maternal grandmother, and mother; Hypertension in his maternal grandfather, maternal grandmother, and mother. ROS:   Please see the history of present illness.    All 14 point review of systems negative except as described per history of present illness.  EKGs/Labs/Other Studies Reviewed:    The following studies were reviewed today:   EKG:       Recent Labs: No results found for requested labs within last 365 days.  Recent Lipid Panel No results found for: CHOL, TRIG, HDL, CHOLHDL, VLDL, LDLCALC, LDLDIRECT  Physical Exam:    VS:  BP (!) 152/94   Pulse 92   Resp (!) 97   Ht 6' 1 (1.854  m)   Wt (!) 307 lb 2 oz (139.3 kg)   BMI 40.52 kg/m     Wt Readings from Last 3 Encounters:  08/04/24 (!) 307 lb 2 oz (139.3 kg)  06/02/24 (!) 303 lb (137.4 kg)  02/20/16 276 lb 12.8 oz (125.6 kg)     GENERAL:  Well nourished, well developed in no acute distress CARDIAC: RRR, S1 and S2 present, no murmurs, no rubs, no gallops Extremities: No pitting pedal edema. NEUROLOGIC:  Alert and oriented x 3  Medication Adjustments/Labs and Tests Ordered: Current medicines are reviewed at length with the patient today.  Concerns regarding medicines are outlined above.  No orders of the defined types were placed in this encounter.  Meds ordered this encounter  Medications   amLODipine  (NORVASC ) 2.5 MG tablet    Sig: Take 1 tablet (2.5 mg total) by mouth daily.    Dispense:  90 tablet    Refill:  3    Signed, Ercil Cassis reddy Aizley Stenseth, MD, MPH, [facility]. 08/04/2024 4:08 PM    [facility]     [1]  Current Meds  Medication Sig   amLODipine  (NORVASC ) 2.5 MG tablet Take 1 tablet (2.5 mg total) by mouth daily.   atorvastatin (LIPITOR) 10 MG tablet Take 5 mg by mouth daily.   "

## 2024-08-04 NOTE — Assessment & Plan Note (Signed)
 Recommended further evaluation for sleep apnea through his PCPs office. Role of treated sleep apnea and high blood pressure and ectopic beats reviewed.  Also recommended regular exercise to target weight loss.

## 2024-08-04 NOTE — Patient Instructions (Signed)
 Medication Instructions:  Your physician has recommended you make the following change in your medication:   START: Amlodipine  2.5 mg daily  *If you need a refill on your cardiac medications before your next appointment, please call your pharmacy*  Lab Work: None If you have labs (blood work) drawn today and your tests are completely normal, you will receive your results only by: MyChart Message (if you have MyChart) OR A paper copy in the mail If you have any lab test that is abnormal or we need to change your treatment, we will call you to review the results.  Testing/Procedures: None  Follow-Up: At [facility], you and your health needs are our priority.  As part of our continuing mission to provide you with exceptional heart care, our providers are all part of one team.  This team includes your primary Cardiologist (physician) and Advanced Practice Providers or APPs (Physician Assistants and Nurse Practitioners) who all work together to provide you with the care you need, when you need it.  Your next appointment:   6 month(s)  Provider:   Alean Kobus, MD    We recommend signing up for the patient portal called MyChart.  Sign up information is provided on this After Visit Summary.  MyChart is used to connect with patients for Virtual Visits (Telemedicine).  Patients are able to view lab/test results, encounter notes, upcoming appointments, etc.  Non-urgent messages can be sent to your provider as well.   To learn more about what you can do with MyChart, go to [email or web address].   Other Instructions None

## 2024-08-04 NOTE — Assessment & Plan Note (Signed)
 Blood work from 05/31/2024 shows hemoglobin 16.5, hematocrit 50.8, platelets 285, WBC 6.9 Lipid panel total cholesterol 156, triglycerides 216, HDL 31 and LDL 88.   Hyperlipidemia management as per primary care provider.  Currently on atorvastatin 5 mg once daily.
